# Patient Record
Sex: Female | Born: 1960 | Race: White | Hispanic: No | Marital: Married | Smoking: Never smoker
Health system: Southern US, Community
[De-identification: ages and names within clinical notes are randomized; demographics above are authoritative.]

## PROBLEM LIST (undated history)

## (undated) DIAGNOSIS — R112 Nausea with vomiting, unspecified: Secondary | ICD-10-CM

## (undated) DIAGNOSIS — Z9889 Other specified postprocedural states: Secondary | ICD-10-CM

## (undated) DIAGNOSIS — Z923 Personal history of irradiation: Secondary | ICD-10-CM

## (undated) DIAGNOSIS — E7800 Pure hypercholesterolemia, unspecified: Secondary | ICD-10-CM

## (undated) DIAGNOSIS — C50919 Malignant neoplasm of unspecified site of unspecified female breast: Secondary | ICD-10-CM

## (undated) DIAGNOSIS — F410 Panic disorder [episodic paroxysmal anxiety] without agoraphobia: Secondary | ICD-10-CM

## (undated) HISTORY — DX: Malignant neoplasm of unspecified site of unspecified female breast: C50.919

## (undated) HISTORY — PX: APPENDECTOMY: SHX54

## (undated) HISTORY — DX: Panic disorder (episodic paroxysmal anxiety): F41.0

## (undated) HISTORY — DX: Pure hypercholesterolemia, unspecified: E78.00

## (undated) HISTORY — PX: ABDOMINAL HYSTERECTOMY: SHX81

---

## 2004-06-09 ENCOUNTER — Ambulatory Visit: Payer: Self-pay | Admitting: Obstetrics and Gynecology

## 2005-06-19 ENCOUNTER — Ambulatory Visit: Payer: Self-pay | Admitting: Obstetrics and Gynecology

## 2006-06-20 ENCOUNTER — Ambulatory Visit: Payer: Self-pay | Admitting: Obstetrics and Gynecology

## 2006-07-02 ENCOUNTER — Ambulatory Visit: Payer: Self-pay | Admitting: Obstetrics and Gynecology

## 2007-06-26 ENCOUNTER — Ambulatory Visit: Payer: Self-pay | Admitting: Obstetrics and Gynecology

## 2007-07-03 ENCOUNTER — Ambulatory Visit: Payer: Self-pay | Admitting: Obstetrics and Gynecology

## 2007-12-30 ENCOUNTER — Ambulatory Visit: Payer: Self-pay | Admitting: General Surgery

## 2008-06-30 ENCOUNTER — Ambulatory Visit: Payer: Self-pay | Admitting: Obstetrics and Gynecology

## 2008-07-20 ENCOUNTER — Ambulatory Visit: Payer: Self-pay | Admitting: Obstetrics and Gynecology

## 2009-07-06 ENCOUNTER — Ambulatory Visit: Payer: Self-pay | Admitting: Obstetrics and Gynecology

## 2010-07-12 ENCOUNTER — Ambulatory Visit: Payer: Self-pay | Admitting: Obstetrics and Gynecology

## 2010-07-19 ENCOUNTER — Ambulatory Visit: Payer: Self-pay | Admitting: Obstetrics and Gynecology

## 2010-07-21 ENCOUNTER — Ambulatory Visit: Payer: Self-pay | Admitting: Obstetrics and Gynecology

## 2011-08-22 ENCOUNTER — Ambulatory Visit: Payer: Self-pay | Admitting: Obstetrics and Gynecology

## 2012-11-06 ENCOUNTER — Ambulatory Visit: Payer: Self-pay | Admitting: Obstetrics and Gynecology

## 2013-09-10 ENCOUNTER — Emergency Department: Payer: Self-pay | Admitting: Emergency Medicine

## 2013-09-10 LAB — CBC
HCT: 44.3 % (ref 35.0–47.0)
HGB: 15.1 g/dL (ref 12.0–16.0)
MCH: 33.4 pg (ref 26.0–34.0)
MCHC: 34.1 g/dL (ref 32.0–36.0)
MCV: 98 fL (ref 80–100)
PLATELETS: 282 10*3/uL (ref 150–440)
RBC: 4.52 10*6/uL (ref 3.80–5.20)
RDW: 12.8 % (ref 11.5–14.5)
WBC: 5.8 10*3/uL (ref 3.6–11.0)

## 2013-09-10 LAB — COMPREHENSIVE METABOLIC PANEL
Albumin: 4.3 g/dL (ref 3.4–5.0)
Alkaline Phosphatase: 82 U/L
Anion Gap: 5 — ABNORMAL LOW (ref 7–16)
BUN: 11 mg/dL (ref 7–18)
Bilirubin,Total: 0.6 mg/dL (ref 0.2–1.0)
CO2: 28 mmol/L (ref 21–32)
CREATININE: 0.85 mg/dL (ref 0.60–1.30)
Calcium, Total: 9.9 mg/dL (ref 8.5–10.1)
Chloride: 106 mmol/L (ref 98–107)
EGFR (African American): >60
EGFR (Non-African Amer.): >60
GLUCOSE: 87 mg/dL (ref 65–99)
Osmolality: 276 (ref 275–301)
Potassium: 4.1 mmol/L (ref 3.5–5.1)
SGOT(AST): 24 U/L (ref 15–37)
SGPT (ALT): 18 U/L (ref 12–78)
Sodium: 139 mmol/L (ref 136–145)
Total Protein: 8.6 g/dL — ABNORMAL HIGH (ref 6.4–8.2)

## 2013-09-10 LAB — TROPONIN I

## 2013-11-19 ENCOUNTER — Ambulatory Visit: Payer: Self-pay | Admitting: Obstetrics and Gynecology

## 2016-05-02 ENCOUNTER — Other Ambulatory Visit: Payer: Self-pay | Admitting: Obstetrics & Gynecology

## 2016-05-02 DIAGNOSIS — Z1231 Encounter for screening mammogram for malignant neoplasm of breast: Secondary | ICD-10-CM

## 2016-05-18 DIAGNOSIS — R072 Precordial pain: Secondary | ICD-10-CM | POA: Insufficient documentation

## 2016-05-18 DIAGNOSIS — E782 Mixed hyperlipidemia: Secondary | ICD-10-CM | POA: Insufficient documentation

## 2016-06-09 ENCOUNTER — Encounter: Payer: Self-pay | Admitting: Radiology

## 2016-06-09 ENCOUNTER — Ambulatory Visit
Admission: RE | Admit: 2016-06-09 | Discharge: 2016-06-09 | Disposition: A | Discharge status: Home / Self Care | Payer: BC Managed Care – PPO | Source: Ambulatory Visit | Attending: Obstetrics & Gynecology | Admitting: Obstetrics & Gynecology

## 2016-06-09 DIAGNOSIS — Z1231 Encounter for screening mammogram for malignant neoplasm of breast: Secondary | ICD-10-CM | POA: Diagnosis not present

## 2017-05-10 ENCOUNTER — Other Ambulatory Visit: Payer: Self-pay | Admitting: Obstetrics & Gynecology

## 2017-05-10 DIAGNOSIS — Z1231 Encounter for screening mammogram for malignant neoplasm of breast: Secondary | ICD-10-CM

## 2017-05-10 DIAGNOSIS — T7491XA Unspecified adult maltreatment, confirmed, initial encounter: Secondary | ICD-10-CM | POA: Insufficient documentation

## 2017-06-18 ENCOUNTER — Ambulatory Visit
Admission: RE | Admit: 2017-06-18 | Discharge: 2017-06-18 | Disposition: A | Discharge status: Home / Self Care | Payer: BC Managed Care – PPO | Source: Ambulatory Visit | Attending: Obstetrics & Gynecology | Admitting: Obstetrics & Gynecology

## 2017-06-18 DIAGNOSIS — Z1231 Encounter for screening mammogram for malignant neoplasm of breast: Secondary | ICD-10-CM | POA: Diagnosis not present

## 2018-05-22 ENCOUNTER — Other Ambulatory Visit: Payer: Self-pay | Admitting: Obstetrics & Gynecology

## 2018-05-22 DIAGNOSIS — Z1231 Encounter for screening mammogram for malignant neoplasm of breast: Secondary | ICD-10-CM

## 2018-06-19 ENCOUNTER — Ambulatory Visit
Admission: RE | Admit: 2018-06-19 | Discharge: 2018-06-19 | Disposition: A | Discharge status: Home / Self Care | Payer: BC Managed Care – PPO | Source: Ambulatory Visit | Attending: Obstetrics & Gynecology | Admitting: Obstetrics & Gynecology

## 2018-06-19 DIAGNOSIS — Z1231 Encounter for screening mammogram for malignant neoplasm of breast: Secondary | ICD-10-CM | POA: Insufficient documentation

## 2018-06-27 ENCOUNTER — Other Ambulatory Visit: Payer: Self-pay | Admitting: Obstetrics & Gynecology

## 2018-06-27 DIAGNOSIS — N6320 Unspecified lump in the left breast, unspecified quadrant: Secondary | ICD-10-CM

## 2018-06-27 DIAGNOSIS — R928 Other abnormal and inconclusive findings on diagnostic imaging of breast: Secondary | ICD-10-CM

## 2018-07-03 DIAGNOSIS — C50919 Malignant neoplasm of unspecified site of unspecified female breast: Secondary | ICD-10-CM

## 2018-07-03 HISTORY — DX: Malignant neoplasm of unspecified site of unspecified female breast: C50.919

## 2018-07-05 ENCOUNTER — Ambulatory Visit
Admission: RE | Admit: 2018-07-05 | Discharge: 2018-07-05 | Disposition: A | Discharge status: Home / Self Care | Payer: BC Managed Care – PPO | Source: Ambulatory Visit | Attending: Obstetrics & Gynecology | Admitting: Obstetrics & Gynecology

## 2018-07-05 DIAGNOSIS — N6320 Unspecified lump in the left breast, unspecified quadrant: Secondary | ICD-10-CM

## 2018-07-05 DIAGNOSIS — R928 Other abnormal and inconclusive findings on diagnostic imaging of breast: Secondary | ICD-10-CM | POA: Diagnosis not present

## 2018-07-09 ENCOUNTER — Other Ambulatory Visit: Payer: Self-pay | Admitting: Obstetrics & Gynecology

## 2018-07-09 DIAGNOSIS — R928 Other abnormal and inconclusive findings on diagnostic imaging of breast: Secondary | ICD-10-CM

## 2018-07-10 ENCOUNTER — Ambulatory Visit
Admission: RE | Admit: 2018-07-10 | Discharge: 2018-07-10 | Disposition: A | Discharge status: Home / Self Care | Payer: BC Managed Care – PPO | Source: Ambulatory Visit | Attending: Obstetrics & Gynecology | Admitting: Obstetrics & Gynecology

## 2018-07-10 DIAGNOSIS — R928 Other abnormal and inconclusive findings on diagnostic imaging of breast: Secondary | ICD-10-CM

## 2018-07-10 HISTORY — PX: BREAST BIOPSY: SHX20

## 2018-07-11 ENCOUNTER — Other Ambulatory Visit: Payer: Self-pay | Admitting: Anatomic Pathology & Clinical Pathology

## 2018-07-11 NOTE — Progress Notes (Signed)
.  md

## 2018-07-12 ENCOUNTER — Encounter: Payer: Self-pay | Admitting: *Deleted

## 2018-07-12 DIAGNOSIS — C50912 Malignant neoplasm of unspecified site of left female breast: Secondary | ICD-10-CM

## 2018-07-12 NOTE — Progress Notes (Signed)
  Oncology Nurse Navigator Documentation  Navigator Location: [facility] (07/12/18 1100) Referral date to RadOnc/MedOnc: 07/16/18 (07/12/18 1100) )Navigator Encounter Type: Introductory phone call (07/12/18 1100)   Abnormal Finding Date: 07/05/18 (07/12/18 1100) Confirmed Diagnosis Date: 07/12/18 (07/12/18 1100)                   Barriers/Navigation Needs: Coordination of Care (07/12/18 1100)                          Time Spent with Patient: 30 (07/12/18 1100)   Called patient today to establish navigation services.  Patient is newly diagnosed with left invasive mammary carcinoma.  I have scheduled her to see Dr. Janese Banks on 07/16/18 @ 3:00 and Dr. Lysle Pearl on 07/17/18 @ 1:30.  Will give educational material on Tuesday at her appointment.  She is to call if she has any questions or needs.

## 2018-07-15 ENCOUNTER — Other Ambulatory Visit: Payer: Self-pay | Admitting: Anatomic Pathology & Clinical Pathology

## 2018-07-15 LAB — SURGICAL PATHOLOGY

## 2018-07-16 ENCOUNTER — Inpatient Hospital Stay: Payer: BC Managed Care – PPO | Attending: Oncology | Admitting: Oncology

## 2018-07-16 ENCOUNTER — Inpatient Hospital Stay: Payer: BC Managed Care – PPO

## 2018-07-16 ENCOUNTER — Encounter: Payer: Self-pay | Admitting: *Deleted

## 2018-07-16 ENCOUNTER — Encounter: Payer: Self-pay | Admitting: Oncology

## 2018-07-16 VITALS — BP 133/81 | HR 93 | Temp 98.3°F | Resp 18 | Ht 64.0 in | Wt 142.5 lb

## 2018-07-16 DIAGNOSIS — Z8659 Personal history of other mental and behavioral disorders: Secondary | ICD-10-CM | POA: Diagnosis not present

## 2018-07-16 DIAGNOSIS — Z7189 Other specified counseling: Secondary | ICD-10-CM

## 2018-07-16 DIAGNOSIS — Z803 Family history of malignant neoplasm of breast: Secondary | ICD-10-CM

## 2018-07-16 DIAGNOSIS — Z170 Estrogen receptor positive status [ER+]: Secondary | ICD-10-CM | POA: Diagnosis not present

## 2018-07-16 DIAGNOSIS — C50412 Malignant neoplasm of upper-outer quadrant of left female breast: Secondary | ICD-10-CM | POA: Insufficient documentation

## 2018-07-16 DIAGNOSIS — C50912 Malignant neoplasm of unspecified site of left female breast: Secondary | ICD-10-CM

## 2018-07-16 DIAGNOSIS — C779 Secondary and unspecified malignant neoplasm of lymph node, unspecified: Secondary | ICD-10-CM | POA: Insufficient documentation

## 2018-07-16 DIAGNOSIS — Z79899 Other long term (current) drug therapy: Secondary | ICD-10-CM | POA: Diagnosis not present

## 2018-07-16 NOTE — Progress Notes (Signed)
  Oncology Nurse Navigator Documentation  Navigator Location: [facility] (07/16/18 1500) Referral date to RadOnc/MedOnc: 07/16/18 (07/16/18 1500) )                      Patient Visit Type: MedOnc (07/16/18 1500) Treatment Phase: Pre-Tx/Tx Discussion (07/16/18 1500) Barriers/Navigation Needs: Education (07/16/18 1500) Education: Newly Diagnosed Cancer Education (07/16/18 1500) Interventions: Education (07/16/18 1500)     Education Method: Written (07/16/18 1500)                Time Spent with Patient: 35 (07/16/18 1500)   Met patient, her husband and daughter during her initial medical oncology consult with Dr. Janese Banks.  Patient is scheduled to see Dr. Lysle Pearl tomorrow.  She is to let me know her surgery date.  To inform Dr. Janese Banks when path is available.  She will need Oncotype Dx testing.  Gave patient breast cancer educational literature, "My Breast Cancer Treatment Handbook" by Josephine Igo, RN.  She is to call with any questions or needs.

## 2018-07-16 NOTE — Progress Notes (Signed)
Pt here as new breast cancer dx. She is nervous about coming today and the word cancer. She will see Dr. Lysle Pearl the surgeon tom.

## 2018-07-17 LAB — ESTRADIOL

## 2018-07-17 LAB — FSH/LH
FSH: 199.2 m[IU]/mL
LH: 95.7 m[IU]/mL

## 2018-07-18 ENCOUNTER — Encounter: Payer: Self-pay | Admitting: Oncology

## 2018-07-18 ENCOUNTER — Other Ambulatory Visit: Payer: Self-pay | Admitting: Surgery

## 2018-07-18 ENCOUNTER — Ambulatory Visit: Payer: Self-pay | Admitting: Surgery

## 2018-07-18 DIAGNOSIS — C50912 Malignant neoplasm of unspecified site of left female breast: Secondary | ICD-10-CM | POA: Insufficient documentation

## 2018-07-18 DIAGNOSIS — Z7189 Other specified counseling: Secondary | ICD-10-CM | POA: Insufficient documentation

## 2018-07-18 DIAGNOSIS — C50412 Malignant neoplasm of upper-outer quadrant of left female breast: Secondary | ICD-10-CM

## 2018-07-18 NOTE — H&P (View-Only) (Signed)
Subjective:   CC: Malignant neoplasm of upper-outer quadrant of left female breast, unspecified estrogen receptor status (CMS-HCC) [C50.412] HPI:  Tabitha Tucker is a 58 y.o. female who was referred by Tabitha Lack Ward, MD for evaluation of above. Change was noted on last screening mammogram. Patient does not routinely do self breast exams.  Patient has not noted a change on breast exam. Age of menarche was 31. Hx of hysterectomy in her 31s Patient reports hormonal therapy, OCPs for about ten years or so. Patient is G3P2. Age of first live birth was 5. Patient did not breast feed. Patient denies nipple discharge. Patient denies previous breast biopsy. Patient denies a personal history of breast cancer.   Past Medical History:  has a past medical history of Anxiety.  Past Surgical History:  has a past surgical history that includes Colonoscopy (01/08/14); Appendectomy (1978); and Hysterectomy.  Family History: family history is not on file.  Social History:  reports that she has never smoked. She has never used smokeless tobacco. She reports current alcohol use. She reports that she does not use drugs.  Current Medications: has a current medication list which includes the following prescription(s): alprazolam, biotin, coenzyme q10, ferrous fumarate/vit bcomp,c, garlic, selenium, simvastatin, and compound medication.  Allergies:     Allergies as of 07/17/2018  . (No Known Allergies)    ROS:  A 15 point review of systems was performed and was negative except as noted in HPI   Objective:   BP 124/81   Pulse 79   Ht 162.6 cm (5' 4.02")   Wt 64.9 kg (143 lb)   SpO2 98%   BMI 24.53 kg/m   Constitutional :  alert, appears stated age, cooperative and no distress  Lymphatics/Throat:  no asymmetry, masses, or scars  Respiratory:  clear to auscultation bilaterally  Cardiovascular:  regular rate and rhythm  Gastrointestinal: soft, non-tender; bowel sounds normal; no masses,  no  organomegaly.   Musculoskeletal: Steady gait and movement  Skin: Cool and moist  Psychiatric: Normal affect, non-agitated, not confused  Breast:  Chaperone present for exam.  breasts appear normal, no suspicious masses, no skin or nipple changes or axillary nodes.    LABS:   Reason for Addendum #1: Breast Biomarker Results   SPECIMEN SUBMITTED:  A. Breast, left   CLINICAL HISTORY:  Mass/distortion   PRE-OPERATIVE DIAGNOSIS:  R/O IMC   POST-OPERATIVE DIAGNOSIS:  US guided biopsy of LEFT breast at 1 o'clock, 5 cmfn, with placement of  venus-shaped clip      DIAGNOSIS:  A. BREAST, LEFT AT 1:00, 5 CM FROM NIPPLE; ULTRASOUND-GUIDED CORE  BIOPSY:  - INVASIVE MAMMARY CARCINOMA, NO SPECIAL TYPE.   Size of invasive carcinoma: 9 mm in this sample  Histologic grade of invasive carcinoma: Grade 2            Glandular/tubular differentiation score: 3            Nuclear pleomorphism score: 2            Mitotic rate score: 2            Total score: 7  Ductal carcinoma in situ: Not identified  Lymphovascular invasion: Present   ER/PR/HER2: Immunohistochemistry will be performed on block A2, with  reflex to [place] for HER2 2+. The results will be reported in an addendum.   Results were communicated to Tabitha Tucker in the [facility] at 2:35 pm on 07/11/2018.    GROSS DESCRIPTION:  A. Labeled: Left  breast 1:00 5 cm from nipple upper outer quadrant  Received: Formalin  Time/date in fixative: Collected and placed into formalin at 1:27 PM on  07/10/2018  Cold ischemic time: Less than 1 hour  Total fixation time: 8.5 hours  Core pieces: 4  Size: Ranging from 0.8-1.6 cm in length and 0.2 cm in diameter  Description: Needle core biopsy fragments of tan-yellow fibrofatty  tissue  Ink color: Green  Entirely submitted in cassettes 1-2.     Final Diagnosis performed by Allena Napoleon, MD.  Electronically signed  07/11/2018 4:25:53PM   The electronic signature indicates that the named Attending Pathologist  has evaluated the specimen  Technical component performed at [facility], [address], [place],  [place] [address] Lab: [phone] Dir: Rush Farmer, MD, MMM Professional  component performed at [facility], [place], [place], [place] [address] Lab: [phone] Dir: Dellia Nims.  Rubinas, MD   ADDENDUM:  BREAST BIOMARKER TESTS  Estrogen Receptor (ER) Status: POSITIVE            Percentage of cells with nuclear positivity:  Greater than 90%            Average intensity of staining: Strong   Progesterone Receptor (PgR) Status: POSITIVE            Percentage of cells with nuclear positivity: 11-50%            Average intensity of staining: Moderate   HER2 (by immunohistochemistry): Negative (score 0)   Cold Ischemia and Fixation Times: Meet requirements specified in latest  version of the ASCO/CAP guidelines  Testing Performed on Block Number(s): A2   METHODS  Fixative: Formalin  Estrogen Receptor: FDA cleared ([place]) Primary Antibody: SP1  Progesterone Receptor: FDA cleared ([place]) Primary Antibody: 1E2  HER2 (by IHC): FDA cleared ([place]) Primary Antibody: 4B5 ([facility])  Immunohistochemistry controls worked appropriately. Slides were prepared  by [facility], [place], [place], and interpreted by Allena Napoleon,  MD.   This test was developed and its performance characteristics determined  by [facility]. It has not been cleared or approved by the [place] Food and Drug  Administration. The FDA does not require this test to go through  premarket FDA review. This test is used for clinical purposes. It should  not be regarded as investigational or for research. This laboratory is  certified under the Clinical Laboratory Improvement Amendments (CLIA) as  qualified to perform high complexity clinical laboratory testing.          Addendum #1 performed by Allena Napoleon, MD.  Electronically signed  07/15/2018 3:33:31PM  The electronic signature indicates that the named Attending Pathologist  has evaluated the specimen  Technical component performed at [facility], [address] [place],  [place] [address] Lab: [phone] Dir: Rush Farmer, MD, MMM Professional  component performed at [facility], [facility], [place], [place], [place] [address] Lab: [phone] Dir: Dellia Nims.  Rubinas, MD   RADS: CLINICAL DATA: Left breast upper outer quadrant mass/distortion seen on most recent screening mammography.  EXAM: DIGITAL DIAGNOSTIC LEFT MAMMOGRAM WITH CAD AND TOMO  ULTRASOUND LEFT BREAST  COMPARISON: Previous exam(s).  ACR Breast Density Category b: There are scattered areas of fibroglandular density.  FINDINGS: Additional mammographic views of the left breast demonstrate persistent distortion in the left breast upper outer quadrant, middle depth.  Mammographic images were processed with CAD.  On physical exam, subtle palpable abnormalities detected in the left 1 o'clock breast 5 cm from the nipple.  Targeted ultrasound is performed, showing left breast 1 o'clock 5 cm from the nipple a hypoechoic irregular shadowing area which measures 1.7 by 1.2 by 1.6 cm. There is increased vascularity to the periphery of the lesion. This finding likely corresponds to the distortion seen mammographically. There is no evidence of left axillary lymphadenopathy.  IMPRESSION: Left breast 1 o'clock suspicious area of architectural distortion/sonographic irregular shadowing area, for which ultrasound-guided core needle biopsy is recommended.  RECOMMENDATION: Ultrasound-guided core needle biopsy of the left breast.  I have discussed the findings and recommendations with the patient. Results were also provided in writing at the conclusion of the visit. If applicable, a  reminder letter will be sent to the patient regarding the next appointment.  BI-RADS CATEGORY 4: Suspicious.   Electronically Signed By: Fidela Salisbury M.D. On: 07/05/2018 17:32  Assessment:   Malignant neoplasm of upper-outer quadrant of left female breast, unspecified estrogen receptor status (CMS-HCC) [C50.412]  Plan:   1. Malignant neoplasm of upper-outer quadrant of left female breast, unspecified estrogen receptor status (CMS-HCC) [C50.412]  Discussed the risk of surgery including recurrence, chronic pain, post-op infxn, poor/delayed wound healing, poor cosmesis, seroma, hematoma formation, and possible re-operation to address said risks. The risks of general anesthetic, if used, includes MI, CVA, sudden death or even reaction to anesthetic medications also discussed.  Typical post-op recovery time and possbility of activity restrictions were also discussed.  Alternatives include continued observation.  Benefits include possible symptom relief, pathologic evaluation, and/or curative excision.   The patient verbalized understanding and all questions were answered to the patient's satisfaction.  2. Patient has elected to proceed with surgical treatment. Procedure will be scheduled.  Written consent was obtained. Partial mastectomy with SLNB.     Electronically signed by Benjamine Sprague, DO on 07/17/2018 2:29 PM
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
## 2018-07-18 NOTE — H&P (Signed)
Subjective:   CC: Malignant neoplasm of upper-outer quadrant of left female breast, unspecified estrogen receptor status (CMS-HCC) [C50.412] HPI:  Tabitha Tucker is a 57 y.o. female who was referred by Chelsea Crist Ward, MD for evaluation of above. Change was noted on last screening mammogram. Patient does not routinely do self breast exams.  Patient has not noted a change on breast exam. Age of menarche was 13. Hx of hysterectomy in her 40s Patient reports hormonal therapy, OCPs for about ten years or so. Patient is G3P2. Age of first live birth was 24. Patient did not breast feed. Patient denies nipple discharge. Patient denies previous breast biopsy. Patient denies a personal history of breast cancer.   Past Medical History:  has a past medical history of Anxiety.  Past Surgical History:  has a past surgical history that includes Colonoscopy (01/08/14); Appendectomy (1978); and Hysterectomy.  Family History: family history is not on file.  Social History:  reports that she has never smoked. She has never used smokeless tobacco. She reports current alcohol use. She reports that she does not use drugs.  Current Medications: has a current medication list which includes the following prescription(s): alprazolam, biotin, coenzyme q10, ferrous fumarate/vit bcomp,c, garlic, selenium, simvastatin, and compound medication.  Allergies:     Allergies as of 07/17/2018  . (No Known Allergies)    ROS:  A 15 point review of systems was performed and was negative except as noted in HPI   Objective:   BP 124/81   Pulse 79   Ht 162.6 cm (5' 4.02")   Wt 64.9 kg (143 lb)   SpO2 98%   BMI 24.53 kg/m   Constitutional :  alert, appears stated age, cooperative and no distress  Lymphatics/Throat:  no asymmetry, masses, or scars  Respiratory:  clear to auscultation bilaterally  Cardiovascular:  regular rate and rhythm  Gastrointestinal: soft, non-tender; bowel sounds normal; no masses,  no  organomegaly.   Musculoskeletal: Steady gait and movement  Skin: Cool and moist  Psychiatric: Normal affect, non-agitated, not confused  Breast:  Chaperone present for exam.  breasts appear normal, no suspicious masses, no skin or nipple changes or axillary nodes.    LABS:   Reason for Addendum #1: Breast Biomarker Results   SPECIMEN SUBMITTED:  A. Breast, left   CLINICAL HISTORY:  Mass/distortion   PRE-OPERATIVE DIAGNOSIS:  R/O IMC   POST-OPERATIVE DIAGNOSIS:  US guided biopsy of LEFT breast at 1 o'clock, 5 cmfn, with placement of  venus-shaped clip      DIAGNOSIS:  A. BREAST, LEFT AT 1:00, 5 CM FROM NIPPLE; ULTRASOUND-GUIDED CORE  BIOPSY:  - INVASIVE MAMMARY CARCINOMA, NO SPECIAL TYPE.   Size of invasive carcinoma: 9 mm in this sample  Histologic grade of invasive carcinoma: Grade 2            Glandular/tubular differentiation score: 3            Nuclear pleomorphism score: 2            Mitotic rate score: 2            Total score: 7  Ductal carcinoma in situ: Not identified  Lymphovascular invasion: Present   ER/PR/HER2: Immunohistochemistry will be performed on block A2, with  reflex to FISH for HER2 2+. The results will be reported in an addendum.   Results were communicated to Samantha Smith in the [facility] at 2:35 pm on 07/11/2018.    GROSS DESCRIPTION:  A. Labeled: Left   breast 1:00 5 cm from nipple upper outer quadrant  Received: Formalin  Time/date in fixative: Collected and placed into formalin at 1:27 PM on  07/10/2018  Cold ischemic time: Less than 1 hour  Total fixation time: 8.5 hours  Core pieces: 4  Size: Ranging from 0.8-1.6 cm in length and 0.2 cm in diameter  Description: Needle core biopsy fragments of tan-yellow fibrofatty  tissue  Ink color: Green  Entirely submitted in cassettes 1-2.     Final Diagnosis performed by Heath Jones, MD.  Electronically signed  07/11/2018 4:25:53PM   The electronic signature indicates that the named Attending Pathologist  has evaluated the specimen  Technical component performed at [facility], [address], [place],  [place] [address] Lab: [phone] Dir: Sanjai Nagendra, MD, MMM Professional  component performed at [facility], [facility], [address], [place], [place] [address] Lab: [phone] Dir: Tara C.  Rubinas, MD   ADDENDUM:  BREAST BIOMARKER TESTS  Estrogen Receptor (ER) Status: POSITIVE            Percentage of cells with nuclear positivity:  Greater than 90%            Average intensity of staining: Strong   Progesterone Receptor (PgR) Status: POSITIVE            Percentage of cells with nuclear positivity: 11-50%            Average intensity of staining: Moderate   HER2 (by immunohistochemistry): Negative (score 0)   Cold Ischemia and Fixation Times: Meet requirements specified in latest  version of the ASCO/CAP guidelines  Testing Performed on Block Number(s): A2   METHODS  Fixative: Formalin  Estrogen Receptor: FDA cleared ([place]) Primary Antibody: SP1  Progesterone Receptor: FDA cleared ([place]) Primary Antibody: 1E2  HER2 (by IHC): FDA cleared ([place]) Primary Antibody: 4B5 ([facility])  Immunohistochemistry controls worked appropriately. Slides were prepared  by [facility], [place], [place], and interpreted by Heath Jones,  MD.   This test was developed and its performance characteristics determined  by [facility]. It has not been cleared or approved by the US Food and Drug  Administration. The FDA does not require this test to go through  premarket FDA review. This test is used for clinical purposes. It should  not be regarded as investigational or for research. This laboratory is  certified under the Clinical Laboratory Improvement Amendments (CLIA) as  qualified to perform high complexity clinical laboratory testing.          Addendum #1 performed by Heath Jones, MD.  Electronically signed  07/15/2018 3:33:31PM  The electronic signature indicates that the named Attending Pathologist  has evaluated the specimen  Technical component performed at [facility], [address], [place],  [place] [address] Lab: [phone] Dir: Sanjai Nagendra, MD, MMM Professional  component performed at [facility], [facility], [address], [place],  [address] Lab: [phone] Dir: Tara C.  Rubinas, MD   RADS: CLINICAL DATA: Left breast upper outer quadrant mass/distortion seen on most recent screening mammography.  EXAM: DIGITAL DIAGNOSTIC LEFT MAMMOGRAM WITH CAD AND TOMO  ULTRASOUND LEFT BREAST  COMPARISON: Previous exam(s).  ACR Breast Density Category b: There are scattered areas of fibroglandular density.  FINDINGS: Additional mammographic views of the left breast demonstrate persistent distortion in the left breast upper outer quadrant, middle depth.  Mammographic images were processed with CAD.  On physical exam, subtle palpable abnormalities detected in the left 1 o'clock breast 5 cm from the nipple.    Targeted ultrasound is performed, showing left breast 1 o'clock 5 cm from the nipple a hypoechoic irregular shadowing area which measures 1.7 by 1.2 by 1.6 cm. There is increased vascularity to the periphery of the lesion. This finding likely corresponds to the distortion seen mammographically. There is no evidence of left axillary lymphadenopathy.  IMPRESSION: Left breast 1 o'clock suspicious area of architectural distortion/sonographic irregular shadowing area, for which ultrasound-guided core needle biopsy is recommended.  RECOMMENDATION: Ultrasound-guided core needle biopsy of the left breast.  I have discussed the findings and recommendations with the patient. Results were also provided in writing at the conclusion of the visit. If applicable, a  reminder letter will be sent to the patient regarding the next appointment.  BI-RADS CATEGORY 4: Suspicious.   Electronically Signed By: Dobrinka Dimitrova M.D. On: 07/05/2018 17:32  Assessment:   Malignant neoplasm of upper-outer quadrant of left female breast, unspecified estrogen receptor status (CMS-HCC) [C50.412]  Plan:   1. Malignant neoplasm of upper-outer quadrant of left female breast, unspecified estrogen receptor status (CMS-HCC) [C50.412]  Discussed the risk of surgery including recurrence, chronic pain, post-op infxn, poor/delayed wound healing, poor cosmesis, seroma, hematoma formation, and possible re-operation to address said risks. The risks of general anesthetic, if used, includes MI, CVA, sudden death or even reaction to anesthetic medications also discussed.  Typical post-op recovery time and possbility of activity restrictions were also discussed.  Alternatives include continued observation.  Benefits include possible symptom relief, pathologic evaluation, and/or curative excision.   The patient verbalized understanding and all questions were answered to the patient's satisfaction.  2. Patient has elected to proceed with surgical treatment. Procedure will be scheduled.  Written consent was obtained. Partial mastectomy with SLNB.     Electronically signed by Esdras Delair, DO on 07/17/2018 2:29 PM      

## 2018-07-19 ENCOUNTER — Other Ambulatory Visit: Payer: Self-pay | Admitting: Surgery

## 2018-07-19 DIAGNOSIS — C50412 Malignant neoplasm of upper-outer quadrant of left female breast: Secondary | ICD-10-CM

## 2018-07-23 ENCOUNTER — Other Ambulatory Visit: Payer: Self-pay

## 2018-07-23 ENCOUNTER — Encounter
Admission: RE | Admit: 2018-07-23 | Discharge: 2018-07-23 | Disposition: A | Discharge status: Home / Self Care | Payer: BC Managed Care – PPO | Source: Ambulatory Visit | Attending: Surgery | Admitting: Surgery

## 2018-07-23 ENCOUNTER — Other Ambulatory Visit: Payer: Self-pay | Admitting: *Deleted

## 2018-07-23 NOTE — Patient Instructions (Signed)
Your procedure is scheduled on: 07/25/2018 REPORT TO [place] AT 7:45 AM    Remember: Instructions that are not followed completely may result in serious medical risk, up to and including death, or upon the discretion of your surgeon and anesthesiologist your surgery may need to be rescheduled.     _X__ 1. Do not eat food after midnight the night before your procedure.                 No gum chewing or hard candies. You may drink clear liquids up to 2 hours                 before you are scheduled to arrive for your surgery- DO not drink clear                 liquids within 2 hours of the start of your surgery.                 Clear Liquids include:  water, apple juice without pulp, clear carbohydrate                 drink such as Clearfast or Gatorade, Black Coffee or Tea (Do not add                 anything to coffee or tea).  __X__2.  On the morning of surgery brush your teeth with toothpaste and water, you                 may rinse your mouth with mouthwash if you wish.  Do not swallow any              toothpaste of mouthwash.     _X__ 3.  No Alcohol for 24 hours before or after surgery.   _X__ 4.  Do Not Smoke or use e-cigarettes For 24 Hours Prior to Your Surgery.                 Do not use any chewable tobacco products for at least 6 hours prior to                 surgery.  ____  5.  Bring all medications with you on the day of surgery if instructed.   __X__  6.  Notify your doctor if there is any change in your medical condition      (cold, fever, infections).     Do not wear jewelry, make-up, hairpins, clips or nail polish. Do not wear lotions, powders, or perfumes.  Do not shave 48 hours prior to surgery. Men may shave face and neck. Do not bring valuables to the hospital.    [facility] is not responsible for any belongings or valuables.  Contacts, dentures/partials or body piercings may not be worn into surgery. Bring a case for your contacts, glasses or  hearing aids, a denture cup will be supplied. Leave your suitcase in the car. After surgery it may be brought to your room. For patients admitted to the hospital, discharge time is determined by your treatment team.   Patients discharged the day of surgery will not be allowed to drive home.   Please read over the following fact sheets that you were given:     __X__ Take these medicines the morning of surgery with A SIP OF WATER:    1. XANAX IF NEEDED  2.   3.   4.  5.  6.  ____ Fleet Enema (as  directed)   ____ Use CHG Soap/SAGE wipes as directed  ____ Use inhalers on the day of surgery  ____ Stop metformin/Janumet/Farxiga 2 days prior to surgery    ____ Take 1/2 of usual insulin dose the night before surgery. No insulin the morning          of surgery.   ____ Stop Blood Thinners Coumadin/Plavix/Xarelto/Pleta/Pradaxa/Eliquis/Effient/Aspirin  on   Or contact your Surgeon, Cardiologist or Medical Doctor regarding  ability to stop your blood thinners  __X__ Stop Anti-inflammatories 7 days before surgery such as Advil, Ibuprofen, Motrin,  BC or Goodies Powder, Naprosyn, Naproxen, Aleve, Aspirin    __X__ Stop all herbal supplements, fish oil or vitamin E until after surgery.    ____ Bring C-Pap to the hospital.      PT WAS PHONE INTERVIEW 07/23/2018. VERBAL INSTRUCTIONS GIVEN, PT VERBALIZED UNDERSTANDING./CNEWMAN RN

## 2018-07-24 MED ORDER — CEFAZOLIN SODIUM-DEXTROSE 2-4 GM/100ML-% IV SOLN
2.0000 g | INTRAVENOUS | Status: AC
  Administered 2018-07-25: 2 g via INTRAVENOUS

## 2018-07-25 ENCOUNTER — Ambulatory Visit
Admission: RE | Admit: 2018-07-25 | Discharge: 2018-07-25 | Disposition: A | Discharge status: Home / Self Care | Payer: BC Managed Care – PPO | Source: Ambulatory Visit | Attending: Surgery | Admitting: Surgery

## 2018-07-25 ENCOUNTER — Other Ambulatory Visit: Payer: Self-pay

## 2018-07-25 ENCOUNTER — Other Ambulatory Visit: Payer: Self-pay | Admitting: Surgery

## 2018-07-25 ENCOUNTER — Encounter: Payer: Self-pay | Admitting: Emergency Medicine

## 2018-07-25 ENCOUNTER — Encounter: Payer: Self-pay | Admitting: Anesthesiology

## 2018-07-25 ENCOUNTER — Encounter
Admission: RE | Disposition: A | Discharge status: Home / Self Care | Payer: Self-pay | Source: Ambulatory Visit | Attending: Surgery

## 2018-07-25 ENCOUNTER — Encounter
Admission: RE | Admit: 2018-07-25 | Discharge: 2018-07-25 | Disposition: A | Discharge status: Home / Self Care | Payer: BC Managed Care – PPO | Source: Ambulatory Visit | Attending: Surgery | Admitting: Surgery

## 2018-07-25 ENCOUNTER — Ambulatory Visit: Payer: BC Managed Care – PPO | Admitting: Anesthesiology

## 2018-07-25 DIAGNOSIS — C50412 Malignant neoplasm of upper-outer quadrant of left female breast: Secondary | ICD-10-CM

## 2018-07-25 DIAGNOSIS — Z79899 Other long term (current) drug therapy: Secondary | ICD-10-CM | POA: Insufficient documentation

## 2018-07-25 DIAGNOSIS — Z170 Estrogen receptor positive status [ER+]: Secondary | ICD-10-CM | POA: Insufficient documentation

## 2018-07-25 DIAGNOSIS — C773 Secondary and unspecified malignant neoplasm of axilla and upper limb lymph nodes: Secondary | ICD-10-CM | POA: Insufficient documentation

## 2018-07-25 DIAGNOSIS — F419 Anxiety disorder, unspecified: Secondary | ICD-10-CM | POA: Diagnosis not present

## 2018-07-25 HISTORY — PX: BREAST LUMPECTOMY WITH NEEDLE LOCALIZATION AND AXILLARY SENTINEL LYMPH NODE BX: SHX5760

## 2018-07-25 HISTORY — PX: BREAST LUMPECTOMY: SHX2

## 2018-07-25 HISTORY — DX: Other specified postprocedural states: Z98.890

## 2018-07-25 HISTORY — DX: Nausea with vomiting, unspecified: R11.2

## 2018-07-25 SURGERY — BREAST LUMPECTOMY WITH NEEDLE LOCALIZATION AND AXILLARY SENTINEL LYMPH NODE BX
Anesthesia: General | Laterality: Left

## 2018-07-25 MED ORDER — ONDANSETRON HCL 4 MG/2ML IJ SOLN
INTRAMUSCULAR | Status: AC
  Filled 2018-07-25: qty 2

## 2018-07-25 MED ORDER — LACTATED RINGERS IV SOLN
INTRAVENOUS | Status: DC
  Administered 2018-07-25: 10:00:00 via INTRAVENOUS

## 2018-07-25 MED ORDER — GLYCOPYRROLATE 0.2 MG/ML IJ SOLN
INTRAMUSCULAR | Status: AC
  Filled 2018-07-25: qty 1

## 2018-07-25 MED ORDER — PROPOFOL 500 MG/50ML IV EMUL
INTRAVENOUS | Status: AC
  Filled 2018-07-25: qty 50

## 2018-07-25 MED ORDER — FENTANYL CITRATE (PF) 100 MCG/2ML IJ SOLN
INTRAMUSCULAR | Status: DC | PRN
  Administered 2018-07-25: 25 ug via INTRAVENOUS
  Administered 2018-07-25: 50 ug via INTRAVENOUS
  Administered 2018-07-25: 25 ug via INTRAVENOUS

## 2018-07-25 MED ORDER — CEFAZOLIN SODIUM-DEXTROSE 2-4 GM/100ML-% IV SOLN
INTRAVENOUS | Status: AC
  Filled 2018-07-25: qty 100

## 2018-07-25 MED ORDER — FENTANYL CITRATE (PF) 100 MCG/2ML IJ SOLN
INTRAMUSCULAR | Status: AC
  Filled 2018-07-25: qty 2

## 2018-07-25 MED ORDER — ONDANSETRON HCL 4 MG/2ML IJ SOLN: 4.0000 mg | Freq: Once | INTRAMUSCULAR | Status: DC | PRN

## 2018-07-25 MED ORDER — FAMOTIDINE 20 MG PO TABS
20.0000 mg | ORAL_TABLET | Freq: Once | ORAL | Status: AC
  Administered 2018-07-25: 20 mg via ORAL

## 2018-07-25 MED ORDER — ACETAMINOPHEN 325 MG PO TABS: 650.0000 mg | ORAL_TABLET | Freq: Three times a day (TID) | ORAL | 0 refills | Status: AC | PRN

## 2018-07-25 MED ORDER — FENTANYL CITRATE (PF) 100 MCG/2ML IJ SOLN
INTRAMUSCULAR | Status: AC
  Administered 2018-07-25: 25 ug via INTRAVENOUS
  Filled 2018-07-25: qty 2

## 2018-07-25 MED ORDER — MIDAZOLAM HCL 2 MG/2ML IJ SOLN
INTRAMUSCULAR | Status: DC | PRN
  Administered 2018-07-25: 2 mg via INTRAVENOUS

## 2018-07-25 MED ORDER — TECHNETIUM TC 99M SULFUR COLLOID FILTERED
1.0000 | Freq: Once | INTRAVENOUS | Status: AC | PRN
  Administered 2018-07-25: 0.665 via INTRADERMAL

## 2018-07-25 MED ORDER — EPHEDRINE SULFATE 50 MG/ML IJ SOLN
INTRAMUSCULAR | Status: DC | PRN
  Administered 2018-07-25: 5 mg via INTRAVENOUS
  Administered 2018-07-25: 10 mg via INTRAVENOUS

## 2018-07-25 MED ORDER — CHLORHEXIDINE GLUCONATE CLOTH 2 % EX PADS: 6.0000 | MEDICATED_PAD | Freq: Once | CUTANEOUS | Status: DC

## 2018-07-25 MED ORDER — HYDROCODONE-ACETAMINOPHEN 5-325 MG PO TABS: 1.0000 | ORAL_TABLET | Freq: Four times a day (QID) | ORAL | 0 refills | Status: AC | PRN

## 2018-07-25 MED ORDER — GABAPENTIN 300 MG PO CAPS
ORAL_CAPSULE | ORAL | Status: AC
  Filled 2018-07-25: qty 1

## 2018-07-25 MED ORDER — ACETAMINOPHEN 500 MG PO TABS
1000.0000 mg | ORAL_TABLET | ORAL | Status: AC
  Administered 2018-07-25: 1000 mg via ORAL

## 2018-07-25 MED ORDER — CELECOXIB 200 MG PO CAPS
ORAL_CAPSULE | ORAL | Status: AC
  Filled 2018-07-25: qty 1

## 2018-07-25 MED ORDER — SCOPOLAMINE 1 MG/3DAYS TD PT72
1.0000 | MEDICATED_PATCH | Freq: Once | TRANSDERMAL | Status: DC
  Administered 2018-07-25: 1.5 mg via TRANSDERMAL

## 2018-07-25 MED ORDER — HYDROMORPHONE HCL 1 MG/ML IJ SOLN
INTRAMUSCULAR | Status: AC
  Filled 2018-07-25: qty 1

## 2018-07-25 MED ORDER — GABAPENTIN 300 MG PO CAPS
300.0000 mg | ORAL_CAPSULE | ORAL | Status: AC
  Administered 2018-07-25: 300 mg via ORAL

## 2018-07-25 MED ORDER — FENTANYL CITRATE (PF) 100 MCG/2ML IJ SOLN
25.0000 ug | INTRAMUSCULAR | Status: DC | PRN
  Administered 2018-07-25 (×2): 25 ug via INTRAVENOUS

## 2018-07-25 MED ORDER — CELECOXIB 200 MG PO CAPS
200.0000 mg | ORAL_CAPSULE | ORAL | Status: AC
  Administered 2018-07-25: 200 mg via ORAL

## 2018-07-25 MED ORDER — DEXAMETHASONE SODIUM PHOSPHATE 10 MG/ML IJ SOLN
INTRAMUSCULAR | Status: AC
  Filled 2018-07-25: qty 1

## 2018-07-25 MED ORDER — SCOPOLAMINE 1 MG/3DAYS TD PT72
MEDICATED_PATCH | TRANSDERMAL | Status: AC
  Filled 2018-07-25: qty 1

## 2018-07-25 MED ORDER — MIDAZOLAM HCL 2 MG/2ML IJ SOLN
INTRAMUSCULAR | Status: AC
  Filled 2018-07-25: qty 2

## 2018-07-25 MED ORDER — LIDOCAINE HCL (PF) 2 % IJ SOLN
INTRAMUSCULAR | Status: AC
  Filled 2018-07-25: qty 10

## 2018-07-25 MED ORDER — LIDOCAINE HCL 1 % IJ SOLN
INTRAMUSCULAR | Status: DC | PRN
  Administered 2018-07-25: 10 mL via INTRAMUSCULAR

## 2018-07-25 MED ORDER — DOCUSATE SODIUM 100 MG PO CAPS: 100.0000 mg | ORAL_CAPSULE | Freq: Two times a day (BID) | ORAL | 0 refills | Status: DC | PRN

## 2018-07-25 MED ORDER — FAMOTIDINE 20 MG PO TABS
ORAL_TABLET | ORAL | Status: AC
  Filled 2018-07-25: qty 1

## 2018-07-25 MED ORDER — DEXAMETHASONE SODIUM PHOSPHATE 10 MG/ML IJ SOLN
INTRAMUSCULAR | Status: DC | PRN
  Administered 2018-07-25: 10 mg via INTRAVENOUS

## 2018-07-25 MED ORDER — GLYCOPYRROLATE 0.2 MG/ML IJ SOLN
INTRAMUSCULAR | Status: DC | PRN
  Administered 2018-07-25: 0.2 mg via INTRAVENOUS

## 2018-07-25 MED ORDER — PROPOFOL 10 MG/ML IV BOLUS
INTRAVENOUS | Status: DC | PRN
  Administered 2018-07-25: 100 mg via INTRAVENOUS
  Administered 2018-07-25: 30 mg via INTRAVENOUS

## 2018-07-25 MED ORDER — LIDOCAINE HCL (CARDIAC) PF 100 MG/5ML IV SOSY
PREFILLED_SYRINGE | INTRAVENOUS | Status: DC | PRN
  Administered 2018-07-25: 100 mg via INTRAVENOUS

## 2018-07-25 MED ORDER — ONDANSETRON HCL 4 MG/2ML IJ SOLN
INTRAMUSCULAR | Status: DC | PRN
  Administered 2018-07-25: 4 mg via INTRAVENOUS

## 2018-07-25 MED ORDER — ACETAMINOPHEN 500 MG PO TABS
ORAL_TABLET | ORAL | Status: AC
  Filled 2018-07-25: qty 2

## 2018-07-25 MED ORDER — IBUPROFEN 800 MG PO TABS: 800.0000 mg | ORAL_TABLET | Freq: Three times a day (TID) | ORAL | 0 refills | Status: DC | PRN

## 2018-07-25 MED ORDER — PHENYLEPHRINE HCL 10 MG/ML IJ SOLN
INTRAMUSCULAR | Status: DC | PRN
  Administered 2018-07-25: 50 ug via INTRAVENOUS

## 2018-07-25 SURGICAL SUPPLY — 48 items
APPLIER CLIP 11 MED OPEN (CLIP)
BLADE SURG 15 STRL LF DISP TIS (BLADE) ×1 IMPLANT
BLADE SURG 15 STRL SS (BLADE) ×1
CANISTER SUCT 1200ML W/VALVE (MISCELLANEOUS) ×2 IMPLANT
CHLORAPREP W/TINT 26ML (MISCELLANEOUS) ×2 IMPLANT
CLIP APPLIE 11 MED OPEN (CLIP) IMPLANT
CNTNR SPEC 2.5X3XGRAD LEK (MISCELLANEOUS)
CONT SPEC 4OZ STER OR WHT (MISCELLANEOUS)
CONTAINER SPEC 2.5X3XGRAD LEK (MISCELLANEOUS) IMPLANT
COVER WAND RF STERILE (DRAPES) IMPLANT
DERMABOND ADVANCED (GAUZE/BANDAGES/DRESSINGS) ×2
DERMABOND ADVANCED .7 DNX12 (GAUZE/BANDAGES/DRESSINGS) ×2 IMPLANT
DEVICE DUBIN SPECIMEN MAMMOGRA (MISCELLANEOUS) ×2 IMPLANT
DRAPE CHEST BREAST 77X106 FENE (MISCELLANEOUS) IMPLANT
DRAPE LAPAROTOMY TRNSV 106X77 (MISCELLANEOUS) ×2 IMPLANT
DRAPE SHEET LG 3/4 BI-LAMINATE (DRAPES) IMPLANT
ELECT CAUTERY BLADE TIP 2.5 (TIP) ×2
ELECT CAUTERY NEEDLE 2.0 MIC (NEEDLE) IMPLANT
ELECT REM PT RETURN 9FT ADLT (ELECTROSURGICAL) ×2
ELECTRODE CAUTERY BLDE TIP 2.5 (TIP) ×1 IMPLANT
ELECTRODE REM PT RTRN 9FT ADLT (ELECTROSURGICAL) ×1 IMPLANT
GAUZE SPONGE 4X4 12PLY STRL (GAUZE/BANDAGES/DRESSINGS) IMPLANT
GLOVE BIOGEL PI IND STRL 7.0 (GLOVE) ×3 IMPLANT
GLOVE BIOGEL PI INDICATOR 7.0 (GLOVE) ×3
GLOVE SURG SYN 7.0 (GLOVE) ×6 IMPLANT
GOWN STRL REUS W/ TWL LRG LVL3 (GOWN DISPOSABLE) ×3 IMPLANT
GOWN STRL REUS W/TWL LRG LVL3 (GOWN DISPOSABLE) ×3
JACKSON PRATT 10 (INSTRUMENTS) IMPLANT
KIT TURNOVER KIT A (KITS) ×2 IMPLANT
LABEL OR SOLS (LABEL) ×2 IMPLANT
LIGHT WAVEGUIDE WIDE FLAT (MISCELLANEOUS) ×2 IMPLANT
NEEDLE HYPO 22GX1.5 SAFETY (NEEDLE) ×2 IMPLANT
PACK BASIN MINOR ARMC (MISCELLANEOUS) ×2 IMPLANT
SLEVE PROBE SENORX GAMMA FIND (MISCELLANEOUS) ×2 IMPLANT
SUT MNCRL 4-0 (SUTURE) ×1
SUT MNCRL 4-0 27XMFL (SUTURE) ×1
SUT SILK 2 0 (SUTURE) ×1
SUT SILK 2-0 30XBRD TIE 12 (SUTURE) ×1 IMPLANT
SUT SILK 3 0 12 30 (SUTURE) ×2 IMPLANT
SUT SILK 3 0 SH 30 (SUTURE) ×4 IMPLANT
SUT SILK 3-0 (SUTURE) ×2 IMPLANT
SUT VIC AB 3-0 SH 27 (SUTURE) ×2
SUT VIC AB 3-0 SH 27X BRD (SUTURE) ×2 IMPLANT
SUTURE MNCRL 4-0 27XMF (SUTURE) ×1 IMPLANT
SYR 10ML LL (SYRINGE) ×2 IMPLANT
SYR 50ML LL SCALE MARK (SYRINGE) IMPLANT
SYR BULB IRRIG 60ML STRL (SYRINGE) ×2 IMPLANT
WATER STERILE IRR 1000ML POUR (IV SOLUTION) ×2 IMPLANT

## 2018-07-25 NOTE — Transfer of Care (Signed)
Immediate Anesthesia Transfer of Care Note  Patient: Tabitha Tucker  Procedure(s) Performed: BREAST LUMPECTOMY WITH NEEDLE LOCALIZATION AND SENTINEL NODE BX (Left )  Patient Location: PACU  Anesthesia Type:General  Level of Consciousness: sedated  Airway & Oxygen Therapy: Patient Spontanous Breathing and Patient connected to face mask oxygen  Post-op Assessment: Report given to RN and Post -op Vital signs reviewed and stable  Post vital signs: Reviewed and stable  Last Vitals:  Vitals Value Taken Time  BP 128/85 07/25/2018  1:08 PM  Temp 36.8 C 07/25/2018  1:08 PM  Pulse 87 07/25/2018  1:10 PM  Resp 11 07/25/2018  1:10 PM  SpO2 100 % 07/25/2018  1:10 PM  Vitals shown include unvalidated device data.  Last Pain:  Vitals:   07/25/18 1308  TempSrc:   PainSc: Asleep         Complications: No apparent anesthesia complications

## 2018-07-25 NOTE — Anesthesia Post-op Follow-up Note (Signed)
Anesthesia QCDR form completed.        

## 2018-07-25 NOTE — Anesthesia Procedure Notes (Signed)
Procedure Name: LMA Insertion Date/Time: 07/25/2018 11:09 AM Performed by: Justus Memory, CRNA Pre-anesthesia Checklist: Patient identified, Patient being monitored, Timeout performed, Emergency Drugs available and Suction available Patient Re-evaluated:Patient Re-evaluated prior to induction Oxygen Delivery Method: Circle system utilized Preoxygenation: Pre-oxygenation with 100% oxygen Induction Type: IV induction Ventilation: Mask ventilation without difficulty LMA: LMA inserted LMA Size: 3.5 Tube type: Oral Number of attempts: 1 Placement Confirmation: positive ETCO2 and breath sounds checked- equal and bilateral Tube secured with: Tape Dental Injury: Teeth and Oropharynx as per pre-operative assessment

## 2018-07-25 NOTE — Anesthesia Preprocedure Evaluation (Signed)
Anesthesia Evaluation  Patient identified by MRN, date of birth, ID band Patient awake    Reviewed: Allergy & Precautions, NPO status , Patient's Chart, lab work & pertinent test results, reviewed documented beta blocker date and time   History of Anesthesia Complications (+) PONV and history of anesthetic complications  Airway Mallampati: II  TM Distance: >3 FB     Dental  (+) Chipped   Pulmonary           Cardiovascular      Neuro/Psych Anxiety    GI/Hepatic   Endo/Other    Renal/GU      Musculoskeletal   Abdominal   Peds  Hematology   Anesthesia Other Findings Hypertensive this am.  Reproductive/Obstetrics                             Anesthesia Physical Anesthesia Plan  ASA: II  Anesthesia Plan: General   Post-op Pain Management:    Induction: Intravenous  PONV Risk Score and Plan:   Airway Management Planned: LMA  Additional Equipment:   Intra-op Plan:   Post-operative Plan:   Informed Consent: I have reviewed the patients History and Physical, chart, labs and discussed the procedure including the risks, benefits and alternatives for the proposed anesthesia with the patient or authorized representative who has indicated his/her understanding and acceptance.       Plan Discussed with: CRNA  Anesthesia Plan Comments:         Anesthesia Quick Evaluation

## 2018-07-25 NOTE — Interval H&P Note (Signed)
History and Physical Interval Note:  07/25/2018 1200  Tabitha Tucker  has presented today for surgery, with the diagnosis of LEFT BREAST CANCER  The various methods of treatment have been discussed with the patient and family. After consideration of risks, benefits and other options for treatment, the patient has consented to  Procedure(s): BREAST LUMPECTOMY WITH NEEDLE LOCALIZATION AND SENTINEL NODE BX (Left) as a surgical intervention .  The patient's history has been reviewed, patient examined, no change in status, stable for surgery.  I have reviewed the patient's chart and labs.  Questions were answered to the patient's satisfaction.     [place] Wherry Lysle Pearl

## 2018-07-25 NOTE — Discharge Instructions (Addendum)
Breast Biopsy, Care After These instructions give you information about caring for yourself after your procedure. Your doctor may also give you more specific instructions. Call your doctor if you have any problems or questions after your procedure. What can I expect after the procedure? After your procedure, it is common to have:  Bruising on your breast.  Numbness, tingling, or pain near your biopsy site. Follow these instructions at home: Medicines  Take over-the-counter and prescription medicines only as told by your doctor.  Do not drive for 24 hours if you were given a medicine to help you relax (sedative) during your procedure.  Do not drink alcohol while taking pain medicine.  Do not drive or use heavy machinery while taking prescription pain medicine. Biopsy site care      Follow instructions from your doctor about how to take care of your cut from surgery (incision) or your puncture area. Make sure you: ? Wash your hands with soap and water before you change your bandage (dressing). If you cannot use soap and water, use hand sanitizer. ? Change your bandage as told by your doctor. ? Leave stitches (sutures), skin glue, or skin tape (adhesive strips) in place. They may need to stay in place for 2 weeks or longer. If tape strips get loose and curl up, you may trim the loose edges. Do not remove tape strips completely unless your doctor says it is okay.  If you have stitches, keep them dry when you take a bath or a shower.  Check your cut or puncture area every day for signs of infection. Check for: ? Redness, swelling, or pain. ? Fluid or blood. ? Warmth. ? Pus or a bad smell.  Protect the biopsy area. Do not let the area get bumped. Activity  If you had a cut during your procedure, avoid activities that could pull your cut open. These include: ? Stretching. ? Reaching over your head. ? Exercise. ? Sports. ? Lifting anything that weighs more than 3 lb (1.4  kg).  Return to your normal activities as told by your doctor. Ask your doctor what activities are safe for you. Managing pain, stiffness, and swelling If told, put ice on the biopsy site to relieve swelling:  Put ice in a plastic bag.  Place a towel between your skin and the bag.  Leave the ice on for 20 minutes, 2-3 times a day. General instructions  Continue your normal diet.  Wear a good support bra for as long as told by your doctor.  Get checked for extra fluid around your lymph nodes (lymphedema) as often as told by your doctor.  Keep all follow-up visits as told by your doctor. This is important. Contact a doctor if:  You notice any of the following at the biopsy site: ? More redness, swelling, or pain. ? More fluid or blood coming from the site. ? The site feels warm to the touch. ? Pus or a bad smell coming from the site. ? The site breaks open after the stitches or skin tape strips have been removed.  You have a rash.  You have a fever. Get help right away if:  You have more bleeding from the biopsy site. Get help right away if bleeding is more than a small spot.  You have trouble breathing.  You have red streaks around the biopsy site. Summary  After your procedure, it is common to have bruising, numbness, tingling, or pain near the biopsy site.  Do not drive  or use heavy machinery while taking prescription pain medicine.  Wear a good support bra for as long as told by your doctor.  If you had a cut during your procedure, avoid activities that may pull the cut open. Ask your doctor what activities are safe for you. This information is not intended to replace advice given to you by your health care provider. Make sure you discuss any questions you have with your health care provider. Document Released: 04/15/2009 Document Revised: 12/06/2017 Document Reviewed: 12/06/2017 Elsevier Interactive Patient Education  2019 [place]  After This sheet gives you information about how to care for yourself after your procedure. Your health care provider may also give you more specific instructions. If you have problems or questions, contact your health care provider. What can I expect after the procedure? After the procedure, it is common to have:  Breast swelling.  Breast tenderness.  Stiffness in your arm or shoulder.  A change in the shape and feel of your breast.  Scar tissue that feels hard to the touch in the area where the lump was removed. Follow these instructions at home: Medicines  Take over-the-counter and prescription medicines only as told by your health care provider.  Take your antibiotic medicine as told by your health care provider. Do not stop taking the antibiotic even if you start to feel better.  If you are taking prescription pain medicine, take actions to prevent or treat constipation. Your health care provider may recommend that you: ? Drink enough fluid to keep your urine pale yellow. ? Eat foods that are high in fiber, such as fresh fruits and vegetables, whole grains, and beans. ? Limit foods that are high in fat and processed sugars, such as fried and sweet foods. ? Take an over-the-counter or prescription medicine for constipation. Bathing  Do not take baths, swim, or use a hot tub until your health care provider approves. Ask your health care provider if you may take showers. You may only be allowed to take sponge baths. Incision care      Follow instructions from your health care provider about how to take care of your incision. Make sure you: ? Wash your hands with soap and water before you change your bandage (dressing). If soap and water are not available, use hand sanitizer. ? Change your dressing as told by your health care provider. ? Leave stitches (sutures), skin glue, or adhesive strips in place. These skin closures may need to stay in place for 2 weeks or longer. If  adhesive strip edges start to loosen and curl up, you may trim the loose edges. Do not remove adhesive strips completely unless your health care provider tells you to do that.  Check your incision area every day for signs of infection. Check for: ? Redness, swelling, or pain. ? Fluid or blood. ? Warmth. ? Pus or a bad smell.  Keep your dressing clean and dry.  If you were sent home with a surgical drain in place, follow instructions from your health care provider about emptying it. Activity  Return to your normal activities as told by your health care provider. Ask your health care provider what activities are safe for you.  Be careful to avoid any activities that could cause an injury to your arm on the side of your surgery.  Do not lift anything that is heavier than 10 lb (4.5 kg), or the limit that you are told, until your health care provider says  that it is safe. Avoid lifting with the arm that is on the side of your surgery.  Do not carry heavy objects on your shoulder on the side of your surgery.  After your drain is removed, you should perform exercises to keep your arm from getting stiff and swollen. Talk with your health care provider about which exercises are safe for you. General instructions  Do not drive or use heavy machinery while taking prescription pain medicine.  Wear a supportive bra as told by your health care provider.  Raise (elevate) your arm above the level of your heart while you are sitting or lying down.  Do not wear tight jewelry on your arm, wrist, or fingers on the side of your surgery.  Keep all follow-up visits as told by your health care provider. This is important. ? You may need to be screened for extra fluid around the lymph nodes (lymphedema). Follow instructions from your health care provider about how often you should be checked.  If you had any lymph nodes removed during your procedure, be sure to tell all of your health care providers. This  is important information to share before you are involved in certain procedures, such as having blood tests or having your blood pressure taken. Contact a health care provider if:  You develop a rash.  You have a fever.  Your pain medicine is not working.  Your swelling, weakness, or numbness in your arm has not improved after a few weeks.  You have new swelling in your breast or arm.  You have redness, swelling, or pain in your incision area.  You have fluid or blood coming from your incision.  Your incision feels warm to the touch.  You have pus or a bad smell coming from your incision. Get help right away if:  You have very bad pain in your breast or arm.  You have chest pain.  You have difficulty breathing. Summary  After the procedure, it is common to have breast tenderness, swelling, and stiffness in your arm and shoulder.  Follow instructions from your health care provider about how to take care of your incision.  Do not lift anything that is heavier than 10 lb (4.5 kg), or the limit that you are told, until your health care provider says that it is safe. Avoid lifting with the arm that is on the side of your surgery.  If you had any lymph nodes removed during your procedure, be sure to tell all of your health care providers. This is important information to share before you are involved in certain procedures, such as having blood tests or having your blood pressure taken. This information is not intended to replace advice given to you by your health care provider. Make sure you discuss any questions you have with your health care provider. Document Released: 07/05/2006 Document Revised: 10/16/2017 Document Reviewed: 03/01/2016 Elsevier Interactive Patient Education  2019 [employer].

## 2018-07-25 NOTE — Op Note (Signed)
Preoperative diagnosis:  left breast carcinoma.  Postoperative diagnosis: same.   Procedure: needle-localized left breast partial mastectomy.                      left Axillary Sentinel Lymph node biopsy  Anesthesia: GETA  Surgeon: Dr. Benjamine Sprague  Wound Classification: Clean  Indications: Patient is a 58 y.o. female with a nonpalpable left breast mass noted on mammography with core biopsy demonstrating mammary CA.   requires needle-localized partial mastectomy for treatment with sentinel lymph node biopsy.   Specimen: left Breast mass, Sentinel Lymph nodes x 2, superior lateral margin  Complications: None  Estimated Blood Loss: 83mL  Findings: 1. Specimen mammography shows marker and wire on specimen 2. Pathology call refers gross examination of margins was negative, but close to the superior-lateral portion 3. No other palpable mass or lymph node identified.   Description of procedure: Preoperative needle localization was performed by radiology. In the nuclear medicine suite, the subareolar region was injected with Tc-99 sulfur colloid. Localization studies were reviewed. The patient was taken to the operating room and placed supine on the operating table, and after general anesthesia the left breast and axilla were prepped and draped in the usual sterile fashion. A time-out was completed verifying correct patient, procedure, site, positioning, and implant(s) and/or special equipment prior to beginning this procedure.  By comparing the localization studies with the direction and skin entry site of the needle, the probable trajectory and location of the mass was visualized. A skin incision was planned in such a way as to minimize the amount of dissection to reach the mass.  The skin incision was made after infusion of local. Flaps were raised and the location of the wire confirmed. The wire was delivered into the wound. Sharp and blunt dissection was then taken down to the mass within  breast tissue, measuring 5cm x 4cm x 4cm, taking care to include the entire localizing needle and a margin of grossly normal tissue. The specimen and entire localizing wire were removed. The specimen was oriented with long lateral, short superior, deep double sutures and sent to radiology with the localization studies. Confirmation was received that the entire target lesion had been resected, but superior lateral margin about 20mm on gross examination.  Additional tissue removed in superior lateral margin in case, marked identically as mass, and sent to pathology.  A hand-held gamma probe was used to identify the location of the hottest spot in the axilla. An incision was made around the caudal axillary hairline. Sharp and blunt Dissection was carried down to subdermal facias. The probe was placed within wound and again, the point of maximal count was found. Dissection continue until nodule was identified. The probe was placed in contact with the node and average of 300 counts were recorded. The node was excised in its entirety. Ex vivo, the node measured 300 counts when placed on the probe. The bed of the node measured 20 counts. An additional hot spot was detected and the node was excised in similar fashion, measuring 250s. No additional hot spots were identified. No clinically abnormal nodes were palpated. Both wounds irrigated, hemostasis was achieved and the wound closed in layers with  interrupted sutures of 3-0 Vicryl in deep dermal layer and a running subcuticular suture of Monocryl 4-0, then dressed with dermabond. The patient tolerated the procedure well and was taken to the postanesthesia care unit in stable condition. Sponge and instrument count correct at end of procedure.

## 2018-07-25 NOTE — Anesthesia Postprocedure Evaluation (Signed)
Anesthesia Post Note  Patient: Tabitha Tucker  Procedure(s) Performed: BREAST LUMPECTOMY WITH NEEDLE LOCALIZATION AND SENTINEL NODE BX (Left )  Patient location during evaluation: PACU Anesthesia Type: General Level of consciousness: awake and alert Pain management: pain level controlled Vital Signs Assessment: post-procedure vital signs reviewed and stable Respiratory status: spontaneous breathing, nonlabored ventilation, respiratory function stable and patient connected to nasal cannula oxygen Cardiovascular status: blood pressure returned to baseline and stable Postop Assessment: no apparent nausea or vomiting Anesthetic complications: no     Last Vitals:  Vitals:   07/25/18 1357 07/25/18 1453  BP: (!) 154/75 125/79  Pulse: 86   Resp:  16  Temp: 36.7 C 36.7 C  SpO2: 95% 98%    Last Pain:  Vitals:   07/25/18 1453  TempSrc: Temporal  PainSc: 0-No pain                 Yaretzi Ernandez S

## 2018-07-29 LAB — SURGICAL PATHOLOGY

## 2018-07-30 ENCOUNTER — Encounter: Payer: Self-pay | Admitting: Oncology

## 2018-07-30 ENCOUNTER — Inpatient Hospital Stay (HOSPITAL_BASED_OUTPATIENT_CLINIC_OR_DEPARTMENT_OTHER): Payer: BC Managed Care – PPO | Admitting: Oncology

## 2018-07-30 VITALS — BP 130/85 | HR 50 | Temp 98.3°F | Resp 18 | Ht 64.0 in | Wt 140.6 lb

## 2018-07-30 DIAGNOSIS — Z7189 Other specified counseling: Secondary | ICD-10-CM

## 2018-07-30 DIAGNOSIS — C779 Secondary and unspecified malignant neoplasm of lymph node, unspecified: Secondary | ICD-10-CM

## 2018-07-30 DIAGNOSIS — C50412 Malignant neoplasm of upper-outer quadrant of left female breast: Secondary | ICD-10-CM | POA: Diagnosis not present

## 2018-07-30 DIAGNOSIS — Z170 Estrogen receptor positive status [ER+]: Secondary | ICD-10-CM | POA: Diagnosis not present

## 2018-07-30 NOTE — Progress Notes (Signed)
Patient under arm on left numb from procedure.

## 2018-08-01 NOTE — Progress Notes (Addendum)
Hematology/Oncology Consult note [facility]  [phone] [phone]  Patient Care Team: Ward, Honor Loh, MD as PCP - General (Obstetrics and Gynecology)   Name of the patient: Tabitha Tucker  [id number]  1960/11/27   Date of visit: 08/01/18  Diagnosis-pathological prognostic stage I A p T1c p N1MX invasive mammary carcinoma of the left breast ER PR positive HER-2/neu negative  Chief complaint/ Reason for visit-discuss final pathology results and further management  Heme/Onc history: patient is a 58 year old Caucasian female who recently underwent a screening mammogram which showed 1.7 x 1.2 x 1.6 cm hypoechoic irregular area in her left breast at 1 o'clock position.  No evidence of left axillary adenopathy.  This was 5 biopsied and was invasive mammary carcinoma grade 2, ER greater than 90% positive PR 11 to 50% positive and HER-2/neu negative.  Patient will be seeing surgery tomorrow.  She has never had prior breast biopsies.  She is G2 P2 L2.  She has had a hysterectomy many years ago.  No significant family history of breast cancer, ovarian cancer, colon gastric or pancreatic cancer.   Patient underwent lumpectomy with sentinel lymph node biopsy on 07/25/2018.  Final pathology showed invasive mammary carcinoma 1.7 cm, grade 2 with negative margins.  2 out of 2 sentinel lymph nodes were positive for malignancy.  One lymph node was involved by micrometastatic carcinoma 1.5 mm and the other lymph node was involved with macro metastatic disease 5 mm.  There was focal microscopy extracapsular extension present which I verified with Dr. Reuel Derby from pathology.  pT1c pN1A  Interval history-overall she is doing well since surgery and reports some soreness at the site of lumpectomy but denies other complaints at this time  ECOG PS- 0 Pain scale- 0 Opioid associated constipation- no  Review of systems- Review of Systems  Constitutional: Negative for  chills, fever, malaise/fatigue and weight loss.  HENT: Negative for congestion, ear discharge and nosebleeds.   Eyes: Negative for blurred vision.  Respiratory: Negative for cough, hemoptysis, sputum production, shortness of breath and wheezing.   Cardiovascular: Negative for chest pain, palpitations, orthopnea and claudication.  Gastrointestinal: Negative for abdominal pain, blood in stool, constipation, diarrhea, heartburn, melena, nausea and vomiting.  Genitourinary: Negative for dysuria, flank pain, frequency, hematuria and urgency.  Musculoskeletal: Negative for back pain, joint pain and myalgias.  Skin: Negative for rash.  Neurological: Negative for dizziness, tingling, focal weakness, seizures, weakness and headaches.  Endo/Heme/Allergies: Does not bruise/bleed easily.  Psychiatric/Behavioral: Negative for depression and suicidal ideas. The patient does not have insomnia.        No Known Allergies   Past Medical History:  Diagnosis Date  . Breast cancer ([place])   . High cholesterol   . Panic attacks   . PONV (postoperative nausea and vomiting)      Past Surgical History:  Procedure Laterality Date  . ABDOMINAL HYSTERECTOMY     pt did keep ovaries  . APPENDECTOMY     ate age 89  . BREAST BIOPSY Left 07/10/2018   INVASIVE MAMMARY CARCINOMA  . BREAST LUMPECTOMY Left 07/25/2018   [place],   . BREAST LUMPECTOMY WITH NEEDLE LOCALIZATION AND AXILLARY SENTINEL LYMPH NODE BX Left 07/25/2018   Procedure: BREAST LUMPECTOMY WITH NEEDLE LOCALIZATION AND SENTINEL NODE BX;  Surgeon: Benjamine Sprague, DO;  Location: [facility] ORS;  Service: General;  Laterality: Left;    Social History   Socioeconomic History  . Marital status: Married    Spouse name: Not  on file  . Number of children: Not on file  . Years of education: Not on file  . Highest education level: Not on file  Occupational History  . Not on file  Social Needs  . Financial resource strain: Not on file  . Food insecurity:     Worry: Not on file    Inability: Not on file  . Transportation needs:    Medical: Not on file    Non-medical: Not on file  Tobacco Use  . Smoking status: Never Smoker  . Smokeless tobacco: Never Used  Substance and Sexual Activity  . Alcohol use: Yes    Alcohol/week: 14.0 standard drinks    Types: 14 Glasses of wine per week    Comment: 2 glasses of wine every day  . Drug use: Not Currently    Types: Marijuana    Comment: marijuana when she was a teenager a few time  . Sexual activity: Yes  Lifestyle  . Physical activity:    Days per week: Not on file    Minutes per session: Not on file  . Stress: Not on file  Relationships  . Social connections:    Talks on phone: Not on file    Gets together: Not on file    Attends religious service: Not on file    Active member of club or organization: Not on file    Attends meetings of clubs or organizations: Not on file    Relationship status: Not on file  . Intimate partner violence:    Fear of current or ex partner: Not on file    Emotionally abused: Not on file    Physically abused: Not on file    Forced sexual activity: Not on file  Other Topics Concern  . Not on file  Social History Narrative  . Not on file    Family History  Problem Relation Age of Onset  . Hypertension Father   . Breast cancer Neg Hx      Current Outpatient Medications:  .  acetaminophen (TYLENOL) 325 MG tablet, Take 2 tablets (650 mg total) by mouth every 8 (eight) hours as needed for up to 30 days for mild pain., Disp: 40 tablet, Rfl: 0 .  ALPRAZolam (XANAX) 0.25 MG tablet, Take 0.25 mg by mouth 2 (two) times daily as needed for anxiety., Disp: , Rfl:  .  B Complex-C (SUPER B COMPLEX PO), Take 1 tablet by mouth daily., Disp: , Rfl:  .  Biotin w/ Vitamins C & E (HAIR/SKIN/NAILS PO), Take 1 tablet by mouth daily., Disp: , Rfl:  .  Coenzyme Q10 (CO Q-10) 100 MG CAPS, Take 100 mg by mouth daily., Disp: , Rfl:  .  Garlic 9476 MG CAPS, Take 1,000 mg by  mouth daily., Disp: , Rfl:  .  ibuprofen (ADVIL,MOTRIN) 800 MG tablet, Take 1 tablet (800 mg total) by mouth every 8 (eight) hours as needed for mild pain or moderate pain., Disp: 30 tablet, Rfl: 0 .  SELENIUM PO, Take 250 mcg by mouth daily., Disp: , Rfl:  .  simvastatin (ZOCOR) 10 MG tablet, Take 10 mg by mouth daily., Disp: , Rfl:   Physical exam:  Vitals:   07/30/18 1333  BP: 130/85  Pulse: (!) 50  Resp: 18  Temp: 98.3 F (36.8 C)  TempSrc: Tympanic  Weight: 140 lb 9.6 oz (63.8 kg)  Height: '5\' 4"'  (1.626 m)   Physical Exam Constitutional:      General: She is not in  acute distress. HENT:     Head: Normocephalic and atraumatic.  Eyes:     Pupils: Pupils are equal, round, and reactive to light.  Neck:     Musculoskeletal: Normal range of motion.  Cardiovascular:     Rate and Rhythm: Normal rate and regular rhythm.     Heart sounds: Normal heart sounds.  Pulmonary:     Effort: Pulmonary effort is normal.     Breath sounds: Normal breath sounds.  Abdominal:     General: Bowel sounds are normal.     Palpations: Abdomen is soft.  Skin:    General: Skin is warm and dry.  Neurological:     Mental Status: She is alert and oriented to person, place, and time.      CMP Latest Ref Rng & Units 09/10/2013  Glucose 65 - 99 mg/dL 87  BUN 7 - 18 mg/dL 11  Creatinine 0.60 - 1.30 mg/dL 0.85  Sodium 136 - 145 mmol/L 139  Potassium 3.5 - 5.1 mmol/L 4.1  Chloride 98 - 107 mmol/L 106  CO2 21 - 32 mmol/L 28  Calcium 8.5 - 10.1 mg/dL 9.9  Total Protein 6.4 - 8.2 g/dL 8.6(H)  Total Bilirubin 0.2 - 1.0 mg/dL 0.6  Alkaline Phos Unit/L 82  AST 15 - 37 Unit/L 24  ALT 12 - 78 U/L 18   CBC Latest Ref Rng & Units 09/10/2013  WBC 3.6 - 11.0 x10 3/mm 3 5.8  Hemoglobin 12.0 - 16.0 g/dL 15.1  Hematocrit 35.0 - 47.0 % 44.3  Platelets 150 - 440 x10 3/mm 3 282    No images are attached to the encounter.  Nm Sentinel Node Injection  Result Date: 07/25/2018 CLINICAL DATA:  Left breast  cancer. EXAM: NUCLEAR MEDICINE BREAST LYMPHOSCINTIGRAPHY TECHNIQUE: Intradermal injection of radiopharmaceutical was performed at the 12 o'clock, 3 o'clock, 6 o'clock, and 9 o'clock positions around the left nipple. The patient was then sent to the operating room where the sentinel node(s) were identified and removed by the surgeon. RADIOPHARMACEUTICALS:  Total of 0.665 mCi Millipore-filtered Technetium-36msulfur colloid, injected in four aliquots. IMPRESSION: Uncomplicated intradermal injection of a total of 0.665 mCi Technetium-975mulfur colloid for purposes of sentinel node identification. Electronically Signed   By: AdMarkus Daft.D.   On: 07/25/2018 10:14   Mm Breast Surgical Specimen  Result Date: 07/25/2018 CLINICAL DATA:  89ear old female presenting for left breast lumpectomy. EXAM: SPECIMEN RADIOGRAPH OF THE LEFT BREAST COMPARISON:  Previous exam(s). FINDINGS: Status post excision of the left breast. The wire tip and biopsy marker clip are present and are marked for pathology. IMPRESSION: Specimen radiograph of the left breast. Electronically Signed   By: SeKristopher Oppenheim.D.   On: 07/25/2018 12:15   UsKoreareast Ltd Uni Left Inc Axilla  Result Date: 07/05/2018 CLINICAL DATA:  Left breast upper outer quadrant mass/distortion seen on most recent screening mammography. EXAM: DIGITAL DIAGNOSTIC LEFT MAMMOGRAM WITH CAD AND TOMO ULTRASOUND LEFT BREAST COMPARISON:  Previous exam(s). ACR Breast Density Category b: There are scattered areas of fibroglandular density. FINDINGS: Additional mammographic views of the left breast demonstrate persistent distortion in the left breast upper outer quadrant, middle depth. Mammographic images were processed with CAD. On physical exam, subtle palpable abnormalities detected in the left 1 o'clock breast 5 cm from the nipple. Targeted ultrasound is performed, showing left breast 1 o'clock 5 cm from the nipple a hypoechoic irregular shadowing area which measures 1.7 by 1.2  by 1.6 cm. There is increased vascularity to  the periphery of the lesion. This finding likely corresponds to the distortion seen mammographically. There is no evidence of left axillary lymphadenopathy. IMPRESSION: Left breast 1 o'clock suspicious area of architectural distortion/sonographic irregular shadowing area, for which ultrasound-guided core needle biopsy is recommended. RECOMMENDATION: Ultrasound-guided core needle biopsy of the left breast. I have discussed the findings and recommendations with the patient. Results were also provided in writing at the conclusion of the visit. If applicable, a reminder letter will be sent to the patient regarding the next appointment. BI-RADS CATEGORY  4: Suspicious. Electronically Signed   By: Fidela Salisbury M.D.   On: 07/05/2018 17:32   Mm Diag Breast Tomo Uni Left  Result Date: 07/05/2018 CLINICAL DATA:  Left breast upper outer quadrant mass/distortion seen on most recent screening mammography. EXAM: DIGITAL DIAGNOSTIC LEFT MAMMOGRAM WITH CAD AND TOMO ULTRASOUND LEFT BREAST COMPARISON:  Previous exam(s). ACR Breast Density Category b: There are scattered areas of fibroglandular density. FINDINGS: Additional mammographic views of the left breast demonstrate persistent distortion in the left breast upper outer quadrant, middle depth. Mammographic images were processed with CAD. On physical exam, subtle palpable abnormalities detected in the left 1 o'clock breast 5 cm from the nipple. Targeted ultrasound is performed, showing left breast 1 o'clock 5 cm from the nipple a hypoechoic irregular shadowing area which measures 1.7 by 1.2 by 1.6 cm. There is increased vascularity to the periphery of the lesion. This finding likely corresponds to the distortion seen mammographically. There is no evidence of left axillary lymphadenopathy. IMPRESSION: Left breast 1 o'clock suspicious area of architectural distortion/sonographic irregular shadowing area, for which  ultrasound-guided core needle biopsy is recommended. RECOMMENDATION: Ultrasound-guided core needle biopsy of the left breast. I have discussed the findings and recommendations with the patient. Results were also provided in writing at the conclusion of the visit. If applicable, a reminder letter will be sent to the patient regarding the next appointment. BI-RADS CATEGORY  4: Suspicious. Electronically Signed   By: Fidela Salisbury M.D.   On: 07/05/2018 17:32   Mm Clip Placement Left  Result Date: 07/10/2018 CLINICAL DATA:  Status post ultrasound-guided core biopsy of the left breast. EXAM: DIAGNOSTIC LEFT MAMMOGRAM POST ULTRASOUND BIOPSY COMPARISON:  Previous exam(s). FINDINGS: Mammographic images were obtained following ultrasound guided biopsy of the left breast. Mammographic images show there is a venous shaped clip in appropriate position in the upper-outer quadrant of the left breast. IMPRESSION: Status post ultrasound-guided core biopsy of the left breast with pathology pending. Final Assessment: Post Procedure Mammograms for Marker Placement Electronically Signed   By: Lillia Mountain M.D.   On: 07/10/2018 13:56   [place] Lt Breast Bx W Loc Dev 1st Lesion Img Bx Spec US Guide  Addendum Date: 07/12/2018   ADDENDUM REPORT: 07/12/2018 08:57 ADDENDUM: Invasive mammary carcinoma was reported histologically. This corresponds well with the imaging findings. Patient was contacted by telephone given the results of the biopsy. She states the wound site is clean and dry with no hematoma or signs of infection. The patient will be contacted by the nurse navigator to set up surgical consultation. Electronically Signed   By: Lillia Mountain M.D.   On: 07/12/2018 08:57   Result Date: 07/12/2018 CLINICAL DATA:  Suspicious left breast mass. EXAM: ULTRASOUND GUIDED LEFT BREAST CORE NEEDLE BIOPSY COMPARISON:  Previous exam(s). FINDINGS: I met with the patient and we discussed the procedure of ultrasound-guided biopsy, including  benefits and alternatives. We discussed the high likelihood of a successful procedure. We discussed the risks  of the procedure, including infection, bleeding, tissue injury, clip migration, and inadequate sampling. Informed written consent was given. The usual time-out protocol was performed immediately prior to the procedure. Lesion quadrant: Upper-outer quadrant Using sterile technique and 1% Lidocaine as local anesthetic, under direct ultrasound visualization, a 12 gauge spring-loaded device was used to perform biopsy of a mass in the 1 o'clock region of the left breast using a lateral to medial approach. At the conclusion of the procedure a venous shaped tissue marker clip was deployed into the biopsy cavity. Follow up 2 view mammogram was performed and dictated separately. IMPRESSION: Ultrasound guided biopsy of the left breast. No apparent complications. Electronically Signed: By: Lillia Mountain M.D. On: 07/10/2018 13:34   Mm Lt Plc Breast Loc Dev   1st Lesion  Inc Mammo Guide  Result Date: 07/25/2018 CLINICAL DATA:  58 year old female with biopsy proven invasive mammary carcinoma of the left breast. EXAM: NEEDLE LOCALIZATION OF THE LEFT BREAST WITH MAMMO GUIDANCE COMPARISON:  Previous exams. FINDINGS: Patient presents for needle localization prior to left breast lumpectomy. I met with the patient and we discussed the procedure of needle localization including benefits and alternatives. We discussed the high likelihood of a successful procedure. We discussed the risks of the procedure, including infection, bleeding, tissue injury, and further surgery. Informed, written consent was given. The usual time-out protocol was performed immediately prior to the procedure. Using mammographic guidance, sterile technique, 1% lidocaine and a 7 cm modified Kopans needle, a post biopsy clip at the site of the patient's distortion was localized using superior approach. The images were marked for Dr. Lysle Pearl. IMPRESSION:  Needle localization left breast. No apparent complications. Electronically Signed   By: Kristopher Oppenheim M.D.   On: 07/25/2018 09:07     Assessment and plan- Patient is a 58 y.o. female with newly diagnosed pathological prognostic stage Ia invasive mammary carcinoma pT1 cpN1 aMX ER greater than 90% positive, PR 51 to 90% positive and HER-2/neu negative  I discussed the results of final pathology with the patient in detail.  All the patient had a relatively small size of breast mass 1.7 cm and was strongly estrogen and progesterone positive she did have 2 lymph node involved 1 micrometastatic and macro metastatic.  Discussed that given she she is postmenopausal with a strongly estrogen positive tumor it would be reasonable to obtain a MammaPrint in a clinically high risk case such as her to decide if she would benefit from adjuvant chemotherapy.  MammaPrint assay has been studied and lymph node positive patients up to 3+ lymph nodes.  If MammaPrint score comes back as high risk she would benefit from adjuvant chemotherapy and I would recommend 4 cycles of dose dense AC followed by 12 weekly cycles of Taxol which I will discuss in greater detail with her after the results are back.  If MammaPrint comes back as low risk I would refer her to radiation treatment and discuss hormone therapy with her which would start upon completion of radiation treatment.  Treatment will be given with a curative intent  We also discussed her extracapsular extension which was seen on her pathology which was focal and microscopic and therefore she does not require axillary lymph node dissection which requires gross ECE.  She will definitely benefit from adjuvant radiation therapy to her left breast as well as her lymph nodes.   given that she had 2/2 sentinel LN positive along with extra capsular extension, I would like to get further imaging CT chest  abdomen pelvis with contrast to look for distant metastatic disease especially  given the ECE.  Cancer Staging Malignant neoplasm of left female breast [facility]) Staging form: Breast, AJCC 8th Edition - Clinical stage from 07/16/2018: Stage IA (cT1c, cN0, cM0, G2, ER+, PR+, HER2-) - Signed by Sindy Guadeloupe, MD on 07/18/2018 - Pathologic: Stage IA (pT1c, pN1a, cM0, G2, ER+, PR+, HER2-) - Signed by Sindy Guadeloupe, MD on 08/01/2018     Total face to face encounter time for this patient visit was 40 min. >50% of the time was  spent in counseling and coordination of care.     Visit Diagnosis 1. Malignant neoplasm of upper-outer quadrant of left breast in female, estrogen receptor positive ([place])   2. Goals of care, counseling/discussion      Dr. Randa Evens, MD, MPH [facility] [phone] 08/01/2018 4:04 PM

## 2018-08-05 ENCOUNTER — Telehealth: Payer: Self-pay | Admitting: *Deleted

## 2018-08-05 NOTE — Telephone Encounter (Signed)
Called and made patient aware of the R/S 08/12/18 CT ABD/PEL Patient is aware of the scheduled appt- location, date and time.

## 2018-08-06 ENCOUNTER — Ambulatory Visit: Admission: RE | Admit: 2018-08-06 | Payer: BC Managed Care – PPO | Source: Ambulatory Visit

## 2018-08-07 ENCOUNTER — Telehealth: Payer: Self-pay | Admitting: *Deleted

## 2018-08-07 NOTE — Telephone Encounter (Signed)
Called patient to let her know that we are working on trying to get ct scan approved with her insurance. rcvd a letter that [employer] for [employer] is working on it and they will respond to our additional information within 7 days from 08/06/2018 with response of approved or not. I told pt. That we will contact her about when we get a response. She has surgery appt on 2/7 at 10:45 and I asked her to call me on my cell phone after the visit because if we do not have an answer then we will need to cancel the ct for 2/10. She is agreeable to the plan and she was going to pick up the prep kit after the surgeon appt this Friday.

## 2018-08-08 ENCOUNTER — Encounter: Payer: Self-pay | Admitting: Oncology

## 2018-08-09 ENCOUNTER — Telehealth: Payer: Self-pay | Admitting: *Deleted

## 2018-08-09 DIAGNOSIS — C50911 Malignant neoplasm of unspecified site of right female breast: Secondary | ICD-10-CM | POA: Insufficient documentation

## 2018-08-09 NOTE — Telephone Encounter (Signed)
Called to let patient know that the CT scan has been denied from her insurance.  Dr. Janese Banks suspected that it might have been because she was only a stage I breast cancer but we wanted to look and see if there is anything outstanding due to the positive lymph nodes that she had at.  Patient will come in next week and get results of her MammaPrint and we will go from there.  Patient understands and will be at her appointment next week.

## 2018-08-12 ENCOUNTER — Ambulatory Visit: Payer: BC Managed Care – PPO

## 2018-08-13 ENCOUNTER — Encounter: Payer: Self-pay | Admitting: Oncology

## 2018-08-13 ENCOUNTER — Inpatient Hospital Stay: Payer: BC Managed Care – PPO | Attending: Oncology | Admitting: Oncology

## 2018-08-13 ENCOUNTER — Other Ambulatory Visit: Payer: Self-pay | Admitting: *Deleted

## 2018-08-13 VITALS — BP 146/86 | HR 102 | Temp 98.1°F | Resp 16 | Wt 138.2 lb

## 2018-08-13 DIAGNOSIS — C779 Secondary and unspecified malignant neoplasm of lymph node, unspecified: Secondary | ICD-10-CM

## 2018-08-13 DIAGNOSIS — C50412 Malignant neoplasm of upper-outer quadrant of left female breast: Secondary | ICD-10-CM

## 2018-08-13 DIAGNOSIS — Z791 Long term (current) use of non-steroidal anti-inflammatories (NSAID): Secondary | ICD-10-CM | POA: Insufficient documentation

## 2018-08-13 DIAGNOSIS — Z7189 Other specified counseling: Secondary | ICD-10-CM

## 2018-08-13 DIAGNOSIS — Z79899 Other long term (current) drug therapy: Secondary | ICD-10-CM | POA: Insufficient documentation

## 2018-08-13 DIAGNOSIS — Z170 Estrogen receptor positive status [ER+]: Secondary | ICD-10-CM | POA: Insufficient documentation

## 2018-08-13 NOTE — Progress Notes (Signed)
Pt in for follow up and results.  Pt is "very anxious today".

## 2018-08-13 NOTE — Progress Notes (Signed)
Hematology/Oncology Consult note [facility]  [phone] [phone]  Patient Care Team: Ward, Honor Loh, MD as PCP - General (Obstetrics and Gynecology)   Name of the patient: Tabitha Tucker  [id number]  13-Nov-1960   Date of visit: 08/13/18  Diagnosis- pathological prognostic stage IA p T1c p N1MX invasive mammary carcinoma of the left breast ER PR positive HER-2/neu negative  Chief complaint/ Reason for visit-discuss MammaPrint results and further management  Heme/Onc history: patient is a 58 year old Caucasian female who recently underwent a screening mammogram which showed 1.7 x 1.2 x 1.6 cm hypoechoic irregular area in her left breast at 1 o'clock position. No evidence of left axillary adenopathy. This was 5 biopsied and was invasive mammary carcinoma grade 2, ER greater than 90% positive PR 11 to 50% positive and HER-2/neu negative. Patient will be seeing surgery tomorrow. She has never had prior breast biopsies. She is G2 P2 L2. She has had a hysterectomy many years ago. No significant family history of breast cancer, ovarian cancer, colon gastric or pancreatic cancer.  Patient underwent lumpectomy with sentinel lymph node biopsy on 07/25/2018.  Final pathology showed invasive mammary carcinoma 1.7 cm, grade 2 with negative margins.  2 out of 2 sentinel lymph nodes were positive for malignancy.  One lymph node was involved by micrometastatic carcinoma 1.5 mm and the other lymph node was involved with macro metastatic disease 5 mm.  There was focal microscopy extracapsular extension present which I verified with Dr. Reuel Derby from pathology.  pT1c pN1A.  Systemic CT scans denied by insurance  Patient underwent MammaPrint testing which came back as low risk with no significant benefit from adjuvant chemotherapy.  Interval history-patient is anxious to hear about the results of her mammogram testing.  Denies any specific complaints at this  time  ECOG PS- 0 Pain scale- 0   Review of systems- Review of Systems  Constitutional: Negative for chills, fever, malaise/fatigue and weight loss.  HENT: Negative for congestion, ear discharge and nosebleeds.   Eyes: Negative for blurred vision.  Respiratory: Negative for cough, hemoptysis, sputum production, shortness of breath and wheezing.   Cardiovascular: Negative for chest pain, palpitations, orthopnea and claudication.  Gastrointestinal: Negative for abdominal pain, blood in stool, constipation, diarrhea, heartburn, melena, nausea and vomiting.  Genitourinary: Negative for dysuria, flank pain, frequency, hematuria and urgency.  Musculoskeletal: Negative for back pain, joint pain and myalgias.  Skin: Negative for rash.  Neurological: Negative for dizziness, tingling, focal weakness, seizures, weakness and headaches.  Endo/Heme/Allergies: Does not bruise/bleed easily.  Psychiatric/Behavioral: Negative for depression and suicidal ideas. The patient does not have insomnia.       No Known Allergies   Past Medical History:  Diagnosis Date  . Breast cancer ([place])   . High cholesterol   . Panic attacks   . PONV (postoperative nausea and vomiting)      Past Surgical History:  Procedure Laterality Date  . ABDOMINAL HYSTERECTOMY     pt did keep ovaries  . APPENDECTOMY     ate age 10  . BREAST BIOPSY Left 07/10/2018   INVASIVE MAMMARY CARCINOMA  . BREAST LUMPECTOMY Left 07/25/2018   [place],   . BREAST LUMPECTOMY WITH NEEDLE LOCALIZATION AND AXILLARY SENTINEL LYMPH NODE BX Left 07/25/2018   Procedure: BREAST LUMPECTOMY WITH NEEDLE LOCALIZATION AND SENTINEL NODE BX;  Surgeon: Benjamine Sprague, DO;  Location: [facility] ORS;  Service: General;  Laterality: Left;    Social History   Socioeconomic History  .  Marital status: Married    Spouse name: Not on file  . Number of children: Not on file  . Years of education: Not on file  . Highest education level: Not on file  Occupational  History  . Not on file  Social Needs  . Financial resource strain: Not on file  . Food insecurity:    Worry: Not on file    Inability: Not on file  . Transportation needs:    Medical: Not on file    Non-medical: Not on file  Tobacco Use  . Smoking status: Never Smoker  . Smokeless tobacco: Never Used  Substance and Sexual Activity  . Alcohol use: Yes    Alcohol/week: 14.0 standard drinks    Types: 14 Glasses of wine per week    Comment: 2 glasses of wine every day  . Drug use: Not Currently    Types: Marijuana    Comment: marijuana when she was a teenager a few time  . Sexual activity: Yes  Lifestyle  . Physical activity:    Days per week: Not on file    Minutes per session: Not on file  . Stress: Not on file  Relationships  . Social connections:    Talks on phone: Not on file    Gets together: Not on file    Attends religious service: Not on file    Active member of club or organization: Not on file    Attends meetings of clubs or organizations: Not on file    Relationship status: Not on file  . Intimate partner violence:    Fear of current or ex partner: Not on file    Emotionally abused: Not on file    Physically abused: Not on file    Forced sexual activity: Not on file  Other Topics Concern  . Not on file  Social History Narrative  . Not on file    Family History  Problem Relation Age of Onset  . Hypertension Father   . Breast cancer Neg Hx      Current Outpatient Medications:  .  acetaminophen (TYLENOL) 325 MG tablet, Take 2 tablets (650 mg total) by mouth every 8 (eight) hours as needed for up to 30 days for mild pain., Disp: 40 tablet, Rfl: 0 .  ALPRAZolam (XANAX) 0.25 MG tablet, Take 0.25 mg by mouth 2 (two) times daily as needed for anxiety., Disp: , Rfl:  .  ibuprofen (ADVIL,MOTRIN) 800 MG tablet, Take 1 tablet (800 mg total) by mouth every 8 (eight) hours as needed for mild pain or moderate pain., Disp: 30 tablet, Rfl: 0 .  simvastatin (ZOCOR) 10  MG tablet, Take 10 mg by mouth daily., Disp: , Rfl:  .  B Complex-C (SUPER B COMPLEX PO), Take 1 tablet by mouth daily., Disp: , Rfl:  .  Biotin w/ Vitamins C & E (HAIR/SKIN/NAILS PO), Take 1 tablet by mouth daily., Disp: , Rfl:  .  Coenzyme Q10 (CO Q-10) 100 MG CAPS, Take 100 mg by mouth daily., Disp: , Rfl:  .  Garlic 6803 MG CAPS, Take 1,000 mg by mouth daily., Disp: , Rfl:  .  SELENIUM PO, Take 250 mcg by mouth daily., Disp: , Rfl:   Physical exam:  Vitals:   08/13/18 1141  BP: (!) 146/86  Pulse: (!) 102  Resp: 16  Temp: 98.1 F (36.7 C)  TempSrc: Tympanic  Weight: 138 lb 4 oz (62.7 kg)   Physical Exam Constitutional:      General:  She is not in acute distress. HENT:     Head: Normocephalic and atraumatic.  Eyes:     Pupils: Pupils are equal, round, and reactive to light.  Neck:     Musculoskeletal: Normal range of motion.  Cardiovascular:     Rate and Rhythm: Normal rate and regular rhythm.     Heart sounds: Normal heart sounds.  Pulmonary:     Effort: Pulmonary effort is normal.     Breath sounds: Normal breath sounds.  Abdominal:     General: Bowel sounds are normal.     Palpations: Abdomen is soft.  Skin:    General: Skin is warm and dry.  Neurological:     Mental Status: She is alert and oriented to person, place, and time.      CMP Latest Ref Rng & Units 09/10/2013  Glucose 65 - 99 mg/dL 87  BUN 7 - 18 mg/dL 11  Creatinine 0.60 - 1.30 mg/dL 0.85  Sodium 136 - 145 mmol/L 139  Potassium 3.5 - 5.1 mmol/L 4.1  Chloride 98 - 107 mmol/L 106  CO2 21 - 32 mmol/L 28  Calcium 8.5 - 10.1 mg/dL 9.9  Total Protein 6.4 - 8.2 g/dL 8.6(H)  Total Bilirubin 0.2 - 1.0 mg/dL 0.6  Alkaline Phos Unit/L 82  AST 15 - 37 Unit/L 24  ALT 12 - 78 U/L 18   CBC Latest Ref Rng & Units 09/10/2013  WBC 3.6 - 11.0 x10 3/mm 3 5.8  Hemoglobin 12.0 - 16.0 g/dL 15.1  Hematocrit 35.0 - 47.0 % 44.3  Platelets 150 - 440 x10 3/mm 3 282    No images are attached to the encounter.  Nm  Sentinel Node Injection  Result Date: 07/25/2018 CLINICAL DATA:  Left breast cancer. EXAM: NUCLEAR MEDICINE BREAST LYMPHOSCINTIGRAPHY TECHNIQUE: Intradermal injection of radiopharmaceutical was performed at the 12 o'clock, 3 o'clock, 6 o'clock, and 9 o'clock positions around the left nipple. The patient was then sent to the operating room where the sentinel node(s) were identified and removed by the surgeon. RADIOPHARMACEUTICALS:  Total of 0.665 mCi Millipore-filtered Technetium-30msulfur colloid, injected in four aliquots. IMPRESSION: Uncomplicated intradermal injection of a total of 0.665 mCi Technetium-949mulfur colloid for purposes of sentinel node identification. Electronically Signed   By: AdMarkus Daft.D.   On: 07/25/2018 10:14   Mm Breast Surgical Specimen  Result Date: 07/25/2018 CLINICAL DATA:  [age]ear old female presenting for left breast lumpectomy. EXAM: SPECIMEN RADIOGRAPH OF THE LEFT BREAST COMPARISON:  Previous exam(s). FINDINGS: Status post excision of the left breast. The wire tip and biopsy marker clip are present and are marked for pathology. IMPRESSION: Specimen radiograph of the left breast. Electronically Signed   By: SeKristopher Oppenheim.D.   On: 07/25/2018 12:15   Mm Lt Plc Breast Loc Dev   1st Lesion  Inc Mammo Guide  Result Date: 07/25/2018 CLINICAL DATA:  [age]ear old female with biopsy proven invasive mammary carcinoma of the left breast. EXAM: NEEDLE LOCALIZATION OF THE LEFT BREAST WITH MAMMO GUIDANCE COMPARISON:  Previous exams. FINDINGS: Patient presents for needle localization prior to left breast lumpectomy. I met with the patient and we discussed the procedure of needle localization including benefits and alternatives. We discussed the high likelihood of a successful procedure. We discussed the risks of the procedure, including infection, bleeding, tissue injury, and further surgery. Informed, written consent was given. The usual time-out protocol was performed immediately  prior to the procedure. Using mammographic guidance, sterile technique, 1% lidocaine and a 7 cm  modified Kopans needle, a post biopsy clip at the site of the patient's distortion was localized using superior approach. The images were marked for Dr. Lysle Pearl. IMPRESSION: Needle localization left breast. No apparent complications. Electronically Signed   By: Kristopher Oppenheim M.D.   On: 07/25/2018 09:07     Assessment and plan- Patient is a 58 y.o. female with newly diagnosed invasive mammary carcinoma of the left breast pathological prognostic stage pT1 cpN1 acMX ER PR positive and HER-2/neu negative status post lumpectomy  I discussed the results of MammaPrint which came back as low risk.  Although she was clinically high risk based on MammaPrint testing she does not require any adjuvant chemotherapy at this time.  She was very happy to hear this.  I will make a referral to Dr. Donella Stade at this time to discuss adjuvant radiation treatment.  I did tell her that she would need radiation for her axilla as well.  Given that her tumor was ER PR positive hormone therapy is indicated at this time.  Idiscussed the role for hormone therapy. Given that she is postmenopausal I would favor 5 years of adjuvant hormone therapy with aromatase inhibitor. I discussed the risks and benefits of Arimidex including all but not limited to fatigue, hypercholesterolemia, hot flashes, arthralgias and worsening bone health.  Patient will also need to be on calcium 1200 mg along with vitamin D 800 international units.  We will obtain a baseline bone density scan written information about Arimidex given to the patient. I would like her to finish radiation therapy and start hormone therapy thereafter.  Treatment will be given with a curative intent patient verbalized understanding and agrees to proceed  She will call us and let us know when she is nearing the end of radiation treatment and we will prescribe her Arimidex at that time.  I  will see her back in 3 months with a CMP.  Bone density scan prior   Visit Diagnosis 1. Malignant neoplasm of upper-outer quadrant of left breast in female, estrogen receptor positive ([place])   2. Goals of care, counseling/discussion      Dr. Randa Evens, MD, MPH [facility] [phone] 08/13/2018 12:39 PM

## 2018-08-21 ENCOUNTER — Ambulatory Visit
Admission: RE | Admit: 2018-08-21 | Discharge: 2018-08-21 | Disposition: A | Discharge status: Home / Self Care | Payer: BC Managed Care – PPO | Source: Ambulatory Visit | Attending: Radiation Oncology | Admitting: Radiation Oncology

## 2018-08-21 ENCOUNTER — Encounter: Payer: Self-pay | Admitting: Radiation Oncology

## 2018-08-21 ENCOUNTER — Encounter (INDEPENDENT_AMBULATORY_CARE_PROVIDER_SITE_OTHER): Payer: Self-pay

## 2018-08-21 ENCOUNTER — Other Ambulatory Visit: Payer: Self-pay

## 2018-08-21 VITALS — BP 117/74 | HR 73 | Temp 98.1°F | Resp 16 | Wt 140.0 lb

## 2018-08-21 DIAGNOSIS — Z79899 Other long term (current) drug therapy: Secondary | ICD-10-CM | POA: Diagnosis not present

## 2018-08-21 DIAGNOSIS — C50412 Malignant neoplasm of upper-outer quadrant of left female breast: Secondary | ICD-10-CM

## 2018-08-21 DIAGNOSIS — E7800 Pure hypercholesterolemia, unspecified: Secondary | ICD-10-CM | POA: Diagnosis not present

## 2018-08-21 DIAGNOSIS — Z170 Estrogen receptor positive status [ER+]: Secondary | ICD-10-CM | POA: Diagnosis not present

## 2018-08-21 DIAGNOSIS — Z923 Personal history of irradiation: Secondary | ICD-10-CM | POA: Diagnosis not present

## 2018-08-21 NOTE — Consult Note (Signed)
NEW PATIENT EVALUATION  Name: Tabitha Tucker  MRN: [id number]  Date:   08/21/2018     DOB: 1960/12/19   This 58 y.o. female patient presents to the clinic for initial evaluation of stage I a (T1c N1 M M0 invasive mammary carcinoma the left breast ER/PR positive HER-2/neu negative.  REFERRING PHYSICIAN: Sindy Guadeloupe, MD  CHIEF COMPLAINT:  Chief Complaint  Patient presents with  . Breast Cancer    Initial consultation     DIAGNOSIS: The encounter diagnosis was Malignant neoplasm of upper-outer quadrant of left breast in female, estrogen receptor positive ([place]).   PREVIOUS INVESTIGATIONS:  Mammogram and ultrasound reviewed Clinical notes reviewed Pathology reports reviewed Case presented at breast cancer conference  HPI: patient is a 58 year old female who presented with an abnormal mammogram of her left breast. Mammogram showed a suspicious area of architectural distortion at the 1:00 position confirmed on ultrasound. She underwent ultrasound guided biopsy which was positive for invasive mammary carcinoma. She went on to have a wide local excision and sentinel node biopsy.tumor was 1.7 cm invasive mammary carcinoma overall grade 2. Margins were clear but close at less than 1 mm.one sentinel lymph node showed micrometastatic carcinoma at 1.5 mm. One sentinel lymph node also showed macro metastatic carcinoma at 5 mm.she did have reexcisionat the time of surgery of superior-lateral margins negative for atypical proliferative breast disease. Tumor was ER/PR positive HER-2/neu not overexpressed. Patient did have MammaPrint performed showing low risk for recurrence with no significant benefit from adjuvant chemotherapy. We discussed her case at tumor conference and felt that adding peripheral lymphatic field of radiation would be suitable rather thanputting the patient for additional surgery with axillary lymph node dissection. She is seen today for radiation oncology opinion. She is doing well. She  specifically denies breast tenderness cough or bone pain.  PLANNED TREATMENT REGIMEN: left breast and peripheral lymphatic radiation  PAST MEDICAL HISTORY:  has a past medical history of Breast cancer ([place]), High cholesterol, Panic attacks, and PONV (postoperative nausea and vomiting).    PAST SURGICAL HISTORY:  Past Surgical History:  Procedure Laterality Date  . ABDOMINAL HYSTERECTOMY     pt did keep ovaries  . APPENDECTOMY     ate age 42  . BREAST BIOPSY Left 07/10/2018   INVASIVE MAMMARY CARCINOMA  . BREAST LUMPECTOMY Left 07/25/2018   [place],   . BREAST LUMPECTOMY WITH NEEDLE LOCALIZATION AND AXILLARY SENTINEL LYMPH NODE BX Left 07/25/2018   Procedure: BREAST LUMPECTOMY WITH NEEDLE LOCALIZATION AND SENTINEL NODE BX;  Surgeon: Benjamine Sprague, DO;  Location: [facility] ORS;  Service: General;  Laterality: Left;    FAMILY HISTORY: family history includes Hypertension in her father.  SOCIAL HISTORY:  reports that she has never smoked. She has never used smokeless tobacco. She reports current alcohol use of about 14.0 standard drinks of alcohol per week. She reports previous drug use. Drug: Marijuana.  ALLERGIES: Patient has no known allergies.  MEDICATIONS:  Current Outpatient Medications  Medication Sig Dispense Refill  . acetaminophen (TYLENOL) 325 MG tablet Take 2 tablets (650 mg total) by mouth every 8 (eight) hours as needed for up to 30 days for mild pain. 40 tablet 0  . ALPRAZolam (XANAX) 0.25 MG tablet Take 0.25 mg by mouth 2 (two) times daily as needed for anxiety.    . B Complex-C (SUPER B COMPLEX PO) Take 1 tablet by mouth daily.    . Garlic 5993 MG CAPS Take 1,000 mg by mouth daily.    Marland Kitchen  ibuprofen (ADVIL,MOTRIN) 800 MG tablet Take 1 tablet (800 mg total) by mouth every 8 (eight) hours as needed for mild pain or moderate pain. 30 tablet 0  . simvastatin (ZOCOR) 10 MG tablet Take 10 mg by mouth daily.    . Biotin w/ Vitamins C & E (HAIR/SKIN/NAILS PO) Take 1 tablet by mouth daily.     . Coenzyme Q10 (CO Q-10) 100 MG CAPS Take 100 mg by mouth daily.    . SELENIUM PO Take 250 mcg by mouth daily.     No current facility-administered medications for this encounter.     ECOG PERFORMANCE STATUS:  0 - Asymptomatic  REVIEW OF SYSTEMS:  Patient denies any weight loss, fatigue, weakness, fever, chills or night sweats. Patient denies any loss of vision, blurred vision. Patient denies any ringing  of the ears or hearing loss. No irregular heartbeat. Patient denies heart murmur or history of fainting. Patient denies any chest pain or pain radiating to her upper extremities. Patient denies any shortness of breath, difficulty breathing at night, cough or hemoptysis. Patient denies any swelling in the lower legs. Patient denies any nausea vomiting, vomiting of blood, or coffee ground material in the vomitus. Patient denies any stomach pain. Patient states has had normal bowel movements no significant constipation or diarrhea. Patient denies any dysuria, hematuria or significant nocturia. Patient denies any problems walking, swelling in the joints or loss of balance. Patient denies any skin changes, loss of hair or loss of weight. Patient denies any excessive worrying or anxiety or significant depression. Patient denies any problems with insomnia. Patient denies excessive thirst, polyuria, polydipsia. Patient denies any swollen glands, patient denies easy bruising or easy bleeding. Patient denies any recent infections, allergies or URI. Patient "s visual fields have not changed significantly in recent time.    PHYSICAL EXAM: BP 117/74 (BP Location: Left Arm, Patient Position: Sitting)   Pulse 73   Temp 98.1 F (36.7 C) (Tympanic)   Resp 16   Wt 139 lb 15.9 oz (63.5 kg)   BMI 24.03 kg/m  Left breast is wide local excision scar well healed no dominant mass or nodularity is noted in either breast in 2 positions examined. No axillary or supraclavicular adenopathy is identified.Well-developed  well-nourished patient in NAD. HEENT reveals PERLA, EOMI, discs not visualized.  Oral cavity is clear. No oral mucosal lesions are identified. Neck is clear without evidence of cervical or supraclavicular adenopathy. Lungs are clear to A&P. Cardiac examination is essentially unremarkable with regular rate and rhythm without murmur rub or thrill. Abdomen is benign with no organomegaly or masses noted. Motor sensory and DTR levels are equal and symmetric in the upper and lower extremities. Cranial nerves II through XII are grossly intact. Proprioception is intact. No peripheral adenopathy or edema is identified. No motor or sensory levels are noted. Crude visual fields are within normal range.  LABORATORY DATA: pathology reports reviewed    RADIOLOGY RESULTS:mammogram and ultrasound reviewed   IMPRESSION: stage IA invasive mammary carcinoma the left breast in 58 year old female status post wide local excision and sentinel node biopsy for ER/PR positive HER-2/neu negative diseasewith 2 positive sentinel lymph nodes  PLAN: at this time I like to go ahead with whole breast radiation as well as peripheral lymphatic radiation. Would treat both sites to 5040 cGy in 28 fractions. I would also boost her scar another 1600 cGy based the initial close less than 1 mm margin. Risks and benefits of treatment including skin reaction fatigue alteration of  blood counts inclusion of superficial lung and slight chance of lymphedema in her left upper extremity all were discussed in detail with the patient. She seems to comprehend my treatment plan well. Patient will benefit from aromatase inhibitor treatment after completion of radiation. I have personally set up and ordered CT simulation for early next week. Patient and husband both comprehend my treatment plan well.  I would like to take this opportunity to thank you for allowing me to participate in the care of your patient.Noreene Filbert, MD

## 2018-08-26 ENCOUNTER — Ambulatory Visit
Admission: RE | Admit: 2018-08-26 | Discharge: 2018-08-26 | Disposition: A | Discharge status: Home / Self Care | Payer: BC Managed Care – PPO | Source: Ambulatory Visit | Attending: Radiation Oncology | Admitting: Radiation Oncology

## 2018-08-26 DIAGNOSIS — Z510 Encounter for antineoplastic radiation therapy: Secondary | ICD-10-CM | POA: Diagnosis not present

## 2018-08-26 DIAGNOSIS — C50412 Malignant neoplasm of upper-outer quadrant of left female breast: Secondary | ICD-10-CM | POA: Insufficient documentation

## 2018-08-27 DIAGNOSIS — Z510 Encounter for antineoplastic radiation therapy: Secondary | ICD-10-CM | POA: Diagnosis not present

## 2018-08-30 ENCOUNTER — Other Ambulatory Visit: Payer: Self-pay | Admitting: *Deleted

## 2018-08-30 DIAGNOSIS — Z170 Estrogen receptor positive status [ER+]: Principal | ICD-10-CM

## 2018-08-30 DIAGNOSIS — C50412 Malignant neoplasm of upper-outer quadrant of left female breast: Secondary | ICD-10-CM

## 2018-09-02 ENCOUNTER — Ambulatory Visit
Admission: RE | Admit: 2018-09-02 | Discharge: 2018-09-02 | Disposition: A | Discharge status: Home / Self Care | Payer: BC Managed Care – PPO | Source: Ambulatory Visit | Attending: Radiation Oncology | Admitting: Radiation Oncology

## 2018-09-02 DIAGNOSIS — C50412 Malignant neoplasm of upper-outer quadrant of left female breast: Secondary | ICD-10-CM | POA: Insufficient documentation

## 2018-09-02 DIAGNOSIS — Z510 Encounter for antineoplastic radiation therapy: Secondary | ICD-10-CM | POA: Diagnosis not present

## 2018-09-03 ENCOUNTER — Ambulatory Visit
Admission: RE | Admit: 2018-09-03 | Discharge: 2018-09-03 | Disposition: A | Discharge status: Home / Self Care | Payer: BC Managed Care – PPO | Source: Ambulatory Visit | Attending: Radiation Oncology | Admitting: Radiation Oncology

## 2018-09-03 DIAGNOSIS — Z510 Encounter for antineoplastic radiation therapy: Secondary | ICD-10-CM | POA: Diagnosis not present

## 2018-09-04 ENCOUNTER — Ambulatory Visit
Admission: RE | Admit: 2018-09-04 | Discharge: 2018-09-04 | Disposition: A | Discharge status: Home / Self Care | Payer: BC Managed Care – PPO | Source: Ambulatory Visit | Attending: Radiation Oncology | Admitting: Radiation Oncology

## 2018-09-04 DIAGNOSIS — Z510 Encounter for antineoplastic radiation therapy: Secondary | ICD-10-CM | POA: Diagnosis not present

## 2018-09-05 ENCOUNTER — Ambulatory Visit
Admission: RE | Admit: 2018-09-05 | Discharge: 2018-09-05 | Disposition: A | Discharge status: Home / Self Care | Payer: BC Managed Care – PPO | Source: Ambulatory Visit | Attending: Radiation Oncology | Admitting: Radiation Oncology

## 2018-09-05 DIAGNOSIS — Z510 Encounter for antineoplastic radiation therapy: Secondary | ICD-10-CM | POA: Diagnosis not present

## 2018-09-06 ENCOUNTER — Ambulatory Visit
Admission: RE | Admit: 2018-09-06 | Discharge: 2018-09-06 | Disposition: A | Discharge status: Home / Self Care | Payer: BC Managed Care – PPO | Source: Ambulatory Visit | Attending: Radiation Oncology | Admitting: Radiation Oncology

## 2018-09-06 DIAGNOSIS — Z510 Encounter for antineoplastic radiation therapy: Secondary | ICD-10-CM | POA: Diagnosis not present

## 2018-09-09 ENCOUNTER — Ambulatory Visit
Admission: RE | Admit: 2018-09-09 | Discharge: 2018-09-09 | Disposition: A | Discharge status: Home / Self Care | Payer: BC Managed Care – PPO | Source: Ambulatory Visit | Attending: Radiation Oncology | Admitting: Radiation Oncology

## 2018-09-09 DIAGNOSIS — Z510 Encounter for antineoplastic radiation therapy: Secondary | ICD-10-CM | POA: Diagnosis not present

## 2018-09-10 ENCOUNTER — Ambulatory Visit
Admission: RE | Admit: 2018-09-10 | Discharge: 2018-09-10 | Disposition: A | Discharge status: Home / Self Care | Payer: BC Managed Care – PPO | Source: Ambulatory Visit | Attending: Radiation Oncology | Admitting: Radiation Oncology

## 2018-09-10 DIAGNOSIS — Z510 Encounter for antineoplastic radiation therapy: Secondary | ICD-10-CM | POA: Diagnosis not present

## 2018-09-11 ENCOUNTER — Ambulatory Visit
Admission: RE | Admit: 2018-09-11 | Discharge: 2018-09-11 | Disposition: A | Discharge status: Home / Self Care | Payer: BC Managed Care – PPO | Source: Ambulatory Visit | Attending: Radiation Oncology | Admitting: Radiation Oncology

## 2018-09-11 DIAGNOSIS — Z510 Encounter for antineoplastic radiation therapy: Secondary | ICD-10-CM | POA: Diagnosis not present

## 2018-09-12 ENCOUNTER — Ambulatory Visit
Admission: RE | Admit: 2018-09-12 | Discharge: 2018-09-12 | Disposition: A | Discharge status: Home / Self Care | Payer: BC Managed Care – PPO | Source: Ambulatory Visit | Attending: Radiation Oncology | Admitting: Radiation Oncology

## 2018-09-12 ENCOUNTER — Other Ambulatory Visit: Payer: Self-pay

## 2018-09-12 DIAGNOSIS — Z510 Encounter for antineoplastic radiation therapy: Secondary | ICD-10-CM | POA: Diagnosis not present

## 2018-09-13 ENCOUNTER — Other Ambulatory Visit: Payer: Self-pay

## 2018-09-13 ENCOUNTER — Ambulatory Visit
Admission: RE | Admit: 2018-09-13 | Discharge: 2018-09-13 | Disposition: A | Discharge status: Home / Self Care | Payer: BC Managed Care – PPO | Source: Ambulatory Visit | Attending: Radiation Oncology | Admitting: Radiation Oncology

## 2018-09-13 DIAGNOSIS — Z510 Encounter for antineoplastic radiation therapy: Secondary | ICD-10-CM | POA: Diagnosis not present

## 2018-09-16 ENCOUNTER — Ambulatory Visit
Admission: RE | Admit: 2018-09-16 | Discharge: 2018-09-16 | Disposition: A | Discharge status: Home / Self Care | Payer: BC Managed Care – PPO | Source: Ambulatory Visit | Attending: Radiation Oncology | Admitting: Radiation Oncology

## 2018-09-16 ENCOUNTER — Other Ambulatory Visit: Payer: Self-pay

## 2018-09-16 DIAGNOSIS — Z510 Encounter for antineoplastic radiation therapy: Secondary | ICD-10-CM | POA: Diagnosis not present

## 2018-09-17 ENCOUNTER — Other Ambulatory Visit: Payer: BC Managed Care – PPO

## 2018-09-17 ENCOUNTER — Other Ambulatory Visit: Payer: Self-pay

## 2018-09-17 ENCOUNTER — Ambulatory Visit
Admission: RE | Admit: 2018-09-17 | Discharge: 2018-09-17 | Disposition: A | Discharge status: Home / Self Care | Payer: BC Managed Care – PPO | Source: Ambulatory Visit | Attending: Radiation Oncology | Admitting: Radiation Oncology

## 2018-09-17 DIAGNOSIS — Z510 Encounter for antineoplastic radiation therapy: Secondary | ICD-10-CM | POA: Diagnosis not present

## 2018-09-18 ENCOUNTER — Other Ambulatory Visit: Payer: Self-pay

## 2018-09-18 ENCOUNTER — Inpatient Hospital Stay: Payer: BC Managed Care – PPO

## 2018-09-18 ENCOUNTER — Ambulatory Visit
Admission: RE | Admit: 2018-09-18 | Discharge: 2018-09-18 | Disposition: A | Discharge status: Home / Self Care | Payer: BC Managed Care – PPO | Source: Ambulatory Visit | Attending: Radiation Oncology | Admitting: Radiation Oncology

## 2018-09-18 DIAGNOSIS — Z170 Estrogen receptor positive status [ER+]: Principal | ICD-10-CM

## 2018-09-18 DIAGNOSIS — C779 Secondary and unspecified malignant neoplasm of lymph node, unspecified: Secondary | ICD-10-CM | POA: Insufficient documentation

## 2018-09-18 DIAGNOSIS — C50412 Malignant neoplasm of upper-outer quadrant of left female breast: Secondary | ICD-10-CM | POA: Insufficient documentation

## 2018-09-18 DIAGNOSIS — Z510 Encounter for antineoplastic radiation therapy: Secondary | ICD-10-CM | POA: Diagnosis not present

## 2018-09-18 LAB — CBC
HCT: 40.3 % (ref 36.0–46.0)
Hemoglobin: 13.3 g/dL (ref 12.0–15.0)
MCH: 31.6 pg (ref 26.0–34.0)
MCHC: 33 g/dL (ref 30.0–36.0)
MCV: 95.7 fL (ref 80.0–100.0)
NRBC: 0 % (ref 0.0–0.2)
Platelets: 299 10*3/uL (ref 150–400)
RBC: 4.21 MIL/uL (ref 3.87–5.11)
RDW: 11.7 % (ref 11.5–15.5)
WBC: 5 10*3/uL (ref 4.0–10.5)

## 2018-09-19 ENCOUNTER — Other Ambulatory Visit: Payer: Self-pay

## 2018-09-19 ENCOUNTER — Ambulatory Visit
Admission: RE | Admit: 2018-09-19 | Discharge: 2018-09-19 | Disposition: A | Discharge status: Home / Self Care | Payer: BC Managed Care – PPO | Source: Ambulatory Visit | Attending: Radiation Oncology | Admitting: Radiation Oncology

## 2018-09-19 DIAGNOSIS — Z510 Encounter for antineoplastic radiation therapy: Secondary | ICD-10-CM | POA: Diagnosis not present

## 2018-09-20 ENCOUNTER — Other Ambulatory Visit: Payer: Self-pay

## 2018-09-20 ENCOUNTER — Ambulatory Visit
Admission: RE | Admit: 2018-09-20 | Discharge: 2018-09-20 | Disposition: A | Discharge status: Home / Self Care | Payer: BC Managed Care – PPO | Source: Ambulatory Visit | Attending: Radiation Oncology | Admitting: Radiation Oncology

## 2018-09-20 DIAGNOSIS — Z510 Encounter for antineoplastic radiation therapy: Secondary | ICD-10-CM | POA: Diagnosis not present

## 2018-09-23 ENCOUNTER — Ambulatory Visit
Admission: RE | Admit: 2018-09-23 | Discharge: 2018-09-23 | Disposition: A | Discharge status: Home / Self Care | Payer: BC Managed Care – PPO | Source: Ambulatory Visit | Attending: Radiation Oncology | Admitting: Radiation Oncology

## 2018-09-23 ENCOUNTER — Other Ambulatory Visit: Payer: BC Managed Care – PPO

## 2018-09-23 ENCOUNTER — Other Ambulatory Visit: Payer: Self-pay

## 2018-09-23 DIAGNOSIS — Z510 Encounter for antineoplastic radiation therapy: Secondary | ICD-10-CM | POA: Diagnosis not present

## 2018-09-24 ENCOUNTER — Ambulatory Visit
Admission: RE | Admit: 2018-09-24 | Discharge: 2018-09-24 | Disposition: A | Discharge status: Home / Self Care | Payer: BC Managed Care – PPO | Source: Ambulatory Visit | Attending: Radiation Oncology | Admitting: Radiation Oncology

## 2018-09-24 ENCOUNTER — Other Ambulatory Visit: Payer: Self-pay

## 2018-09-24 DIAGNOSIS — Z510 Encounter for antineoplastic radiation therapy: Secondary | ICD-10-CM | POA: Diagnosis not present

## 2018-09-25 ENCOUNTER — Ambulatory Visit
Admission: RE | Admit: 2018-09-25 | Discharge: 2018-09-25 | Disposition: A | Discharge status: Home / Self Care | Payer: BC Managed Care – PPO | Source: Ambulatory Visit | Attending: Radiation Oncology | Admitting: Radiation Oncology

## 2018-09-25 ENCOUNTER — Other Ambulatory Visit: Payer: Self-pay

## 2018-09-25 DIAGNOSIS — Z510 Encounter for antineoplastic radiation therapy: Secondary | ICD-10-CM | POA: Diagnosis not present

## 2018-09-26 ENCOUNTER — Other Ambulatory Visit: Payer: Self-pay

## 2018-09-26 ENCOUNTER — Ambulatory Visit
Admission: RE | Admit: 2018-09-26 | Discharge: 2018-09-26 | Disposition: A | Discharge status: Home / Self Care | Payer: BC Managed Care – PPO | Source: Ambulatory Visit | Attending: Radiation Oncology | Admitting: Radiation Oncology

## 2018-09-26 DIAGNOSIS — Z510 Encounter for antineoplastic radiation therapy: Secondary | ICD-10-CM | POA: Diagnosis not present

## 2018-09-27 ENCOUNTER — Other Ambulatory Visit: Payer: Self-pay

## 2018-09-27 ENCOUNTER — Ambulatory Visit
Admission: RE | Admit: 2018-09-27 | Discharge: 2018-09-27 | Disposition: A | Discharge status: Home / Self Care | Payer: BC Managed Care – PPO | Source: Ambulatory Visit | Attending: Radiation Oncology | Admitting: Radiation Oncology

## 2018-09-27 DIAGNOSIS — Z510 Encounter for antineoplastic radiation therapy: Secondary | ICD-10-CM | POA: Diagnosis not present

## 2018-09-30 ENCOUNTER — Other Ambulatory Visit: Payer: Self-pay

## 2018-09-30 ENCOUNTER — Ambulatory Visit
Admission: RE | Admit: 2018-09-30 | Discharge: 2018-09-30 | Disposition: A | Discharge status: Home / Self Care | Payer: BC Managed Care – PPO | Source: Ambulatory Visit | Attending: Radiation Oncology | Admitting: Radiation Oncology

## 2018-09-30 DIAGNOSIS — Z510 Encounter for antineoplastic radiation therapy: Secondary | ICD-10-CM | POA: Diagnosis not present

## 2018-10-01 ENCOUNTER — Other Ambulatory Visit: Payer: Self-pay

## 2018-10-01 ENCOUNTER — Ambulatory Visit
Admission: RE | Admit: 2018-10-01 | Discharge: 2018-10-01 | Disposition: A | Discharge status: Home / Self Care | Payer: BC Managed Care – PPO | Source: Ambulatory Visit | Attending: Radiation Oncology | Admitting: Radiation Oncology

## 2018-10-01 DIAGNOSIS — Z510 Encounter for antineoplastic radiation therapy: Secondary | ICD-10-CM | POA: Diagnosis not present

## 2018-10-02 ENCOUNTER — Ambulatory Visit
Admission: RE | Admit: 2018-10-02 | Discharge: 2018-10-02 | Disposition: A | Discharge status: Home / Self Care | Payer: BC Managed Care – PPO | Source: Ambulatory Visit | Attending: Radiation Oncology | Admitting: Radiation Oncology

## 2018-10-02 ENCOUNTER — Inpatient Hospital Stay: Payer: BC Managed Care – PPO | Attending: Oncology

## 2018-10-02 ENCOUNTER — Other Ambulatory Visit: Payer: Self-pay

## 2018-10-02 DIAGNOSIS — Z170 Estrogen receptor positive status [ER+]: Secondary | ICD-10-CM | POA: Insufficient documentation

## 2018-10-02 DIAGNOSIS — C50412 Malignant neoplasm of upper-outer quadrant of left female breast: Secondary | ICD-10-CM

## 2018-10-02 DIAGNOSIS — Z510 Encounter for antineoplastic radiation therapy: Secondary | ICD-10-CM | POA: Insufficient documentation

## 2018-10-02 LAB — CBC
HCT: 40.9 % (ref 36.0–46.0)
Hemoglobin: 13.7 g/dL (ref 12.0–15.0)
MCH: 32.1 pg (ref 26.0–34.0)
MCHC: 33.5 g/dL (ref 30.0–36.0)
MCV: 95.8 fL (ref 80.0–100.0)
Platelets: 245 10*3/uL (ref 150–400)
RBC: 4.27 MIL/uL (ref 3.87–5.11)
RDW: 11.8 % (ref 11.5–15.5)
WBC: 5.3 10*3/uL (ref 4.0–10.5)
nRBC: 0 % (ref 0.0–0.2)

## 2018-10-03 ENCOUNTER — Other Ambulatory Visit: Payer: Self-pay

## 2018-10-03 ENCOUNTER — Ambulatory Visit
Admission: RE | Admit: 2018-10-03 | Discharge: 2018-10-03 | Disposition: A | Discharge status: Home / Self Care | Payer: BC Managed Care – PPO | Source: Ambulatory Visit | Attending: Radiation Oncology | Admitting: Radiation Oncology

## 2018-10-03 DIAGNOSIS — Z510 Encounter for antineoplastic radiation therapy: Secondary | ICD-10-CM | POA: Diagnosis not present

## 2018-10-04 ENCOUNTER — Other Ambulatory Visit: Payer: Self-pay

## 2018-10-04 ENCOUNTER — Ambulatory Visit
Admission: RE | Admit: 2018-10-04 | Discharge: 2018-10-04 | Disposition: A | Discharge status: Home / Self Care | Payer: BC Managed Care – PPO | Source: Ambulatory Visit | Attending: Radiation Oncology | Admitting: Radiation Oncology

## 2018-10-04 ENCOUNTER — Encounter: Payer: Self-pay | Admitting: *Deleted

## 2018-10-04 DIAGNOSIS — Z510 Encounter for antineoplastic radiation therapy: Secondary | ICD-10-CM | POA: Diagnosis not present

## 2018-10-07 ENCOUNTER — Ambulatory Visit
Admission: RE | Admit: 2018-10-07 | Discharge: 2018-10-07 | Disposition: A | Discharge status: Home / Self Care | Payer: BC Managed Care – PPO | Source: Ambulatory Visit | Attending: Radiation Oncology | Admitting: Radiation Oncology

## 2018-10-07 ENCOUNTER — Other Ambulatory Visit: Payer: Self-pay

## 2018-10-07 DIAGNOSIS — Z510 Encounter for antineoplastic radiation therapy: Secondary | ICD-10-CM | POA: Diagnosis not present

## 2018-10-08 ENCOUNTER — Other Ambulatory Visit: Payer: Self-pay

## 2018-10-08 ENCOUNTER — Ambulatory Visit
Admission: RE | Admit: 2018-10-08 | Discharge: 2018-10-08 | Disposition: A | Discharge status: Home / Self Care | Payer: BC Managed Care – PPO | Source: Ambulatory Visit | Attending: Radiation Oncology | Admitting: Radiation Oncology

## 2018-10-08 DIAGNOSIS — Z510 Encounter for antineoplastic radiation therapy: Secondary | ICD-10-CM | POA: Diagnosis not present

## 2018-10-09 ENCOUNTER — Other Ambulatory Visit: Payer: Self-pay

## 2018-10-09 ENCOUNTER — Ambulatory Visit
Admission: RE | Admit: 2018-10-09 | Discharge: 2018-10-09 | Disposition: A | Discharge status: Home / Self Care | Payer: BC Managed Care – PPO | Source: Ambulatory Visit | Attending: Radiation Oncology | Admitting: Radiation Oncology

## 2018-10-09 DIAGNOSIS — Z510 Encounter for antineoplastic radiation therapy: Secondary | ICD-10-CM | POA: Diagnosis not present

## 2018-10-10 ENCOUNTER — Ambulatory Visit
Admission: RE | Admit: 2018-10-10 | Discharge: 2018-10-10 | Disposition: A | Discharge status: Home / Self Care | Payer: BC Managed Care – PPO | Source: Ambulatory Visit | Attending: Radiation Oncology | Admitting: Radiation Oncology

## 2018-10-10 ENCOUNTER — Other Ambulatory Visit: Payer: Self-pay

## 2018-10-10 DIAGNOSIS — Z510 Encounter for antineoplastic radiation therapy: Secondary | ICD-10-CM | POA: Diagnosis not present

## 2018-10-11 ENCOUNTER — Ambulatory Visit
Admission: RE | Admit: 2018-10-11 | Discharge: 2018-10-11 | Disposition: A | Discharge status: Home / Self Care | Payer: BC Managed Care – PPO | Source: Ambulatory Visit | Attending: Radiation Oncology | Admitting: Radiation Oncology

## 2018-10-11 ENCOUNTER — Other Ambulatory Visit: Payer: Self-pay

## 2018-10-11 DIAGNOSIS — Z510 Encounter for antineoplastic radiation therapy: Secondary | ICD-10-CM | POA: Diagnosis not present

## 2018-10-14 ENCOUNTER — Other Ambulatory Visit: Payer: Self-pay

## 2018-10-14 ENCOUNTER — Ambulatory Visit
Admission: RE | Admit: 2018-10-14 | Discharge: 2018-10-14 | Disposition: A | Discharge status: Home / Self Care | Payer: BC Managed Care – PPO | Source: Ambulatory Visit | Attending: Radiation Oncology | Admitting: Radiation Oncology

## 2018-10-14 DIAGNOSIS — Z510 Encounter for antineoplastic radiation therapy: Secondary | ICD-10-CM | POA: Diagnosis not present

## 2018-10-15 ENCOUNTER — Ambulatory Visit
Admission: RE | Admit: 2018-10-15 | Discharge: 2018-10-15 | Disposition: A | Discharge status: Home / Self Care | Payer: BC Managed Care – PPO | Source: Ambulatory Visit | Attending: Radiation Oncology | Admitting: Radiation Oncology

## 2018-10-15 ENCOUNTER — Other Ambulatory Visit: Payer: Self-pay

## 2018-10-15 DIAGNOSIS — Z510 Encounter for antineoplastic radiation therapy: Secondary | ICD-10-CM | POA: Diagnosis not present

## 2018-10-16 ENCOUNTER — Ambulatory Visit
Admission: RE | Admit: 2018-10-16 | Discharge: 2018-10-16 | Disposition: A | Discharge status: Home / Self Care | Payer: BC Managed Care – PPO | Source: Ambulatory Visit | Attending: Radiation Oncology | Admitting: Radiation Oncology

## 2018-10-16 ENCOUNTER — Other Ambulatory Visit: Payer: Self-pay

## 2018-10-16 ENCOUNTER — Inpatient Hospital Stay: Payer: BC Managed Care – PPO | Attending: Oncology

## 2018-10-16 DIAGNOSIS — Z510 Encounter for antineoplastic radiation therapy: Secondary | ICD-10-CM | POA: Diagnosis not present

## 2018-10-16 DIAGNOSIS — C50412 Malignant neoplasm of upper-outer quadrant of left female breast: Secondary | ICD-10-CM

## 2018-10-16 DIAGNOSIS — Z170 Estrogen receptor positive status [ER+]: Principal | ICD-10-CM

## 2018-10-16 LAB — CBC
HCT: 38.9 % (ref 36.0–46.0)
Hemoglobin: 13 g/dL (ref 12.0–15.0)
MCH: 32.1 pg (ref 26.0–34.0)
MCHC: 33.4 g/dL (ref 30.0–36.0)
MCV: 96 fL (ref 80.0–100.0)
Platelets: 240 10*3/uL (ref 150–400)
RBC: 4.05 MIL/uL (ref 3.87–5.11)
RDW: 11.9 % (ref 11.5–15.5)
WBC: 4.3 10*3/uL (ref 4.0–10.5)
nRBC: 0 % (ref 0.0–0.2)

## 2018-10-17 ENCOUNTER — Other Ambulatory Visit: Payer: Self-pay

## 2018-10-17 ENCOUNTER — Ambulatory Visit
Admission: RE | Admit: 2018-10-17 | Discharge: 2018-10-17 | Disposition: A | Discharge status: Home / Self Care | Payer: BC Managed Care – PPO | Source: Ambulatory Visit | Attending: Radiation Oncology | Admitting: Radiation Oncology

## 2018-10-17 DIAGNOSIS — Z510 Encounter for antineoplastic radiation therapy: Secondary | ICD-10-CM | POA: Diagnosis not present

## 2018-10-18 ENCOUNTER — Ambulatory Visit
Admission: RE | Admit: 2018-10-18 | Discharge: 2018-10-18 | Disposition: A | Discharge status: Home / Self Care | Payer: BC Managed Care – PPO | Source: Ambulatory Visit | Attending: Radiation Oncology | Admitting: Radiation Oncology

## 2018-10-18 ENCOUNTER — Other Ambulatory Visit: Payer: Self-pay

## 2018-10-18 DIAGNOSIS — Z510 Encounter for antineoplastic radiation therapy: Secondary | ICD-10-CM | POA: Diagnosis not present

## 2018-10-21 ENCOUNTER — Other Ambulatory Visit: Payer: Self-pay

## 2018-10-21 ENCOUNTER — Ambulatory Visit
Admission: RE | Admit: 2018-10-21 | Discharge: 2018-10-21 | Disposition: A | Discharge status: Home / Self Care | Payer: BC Managed Care – PPO | Source: Ambulatory Visit | Attending: Radiation Oncology | Admitting: Radiation Oncology

## 2018-10-21 DIAGNOSIS — Z510 Encounter for antineoplastic radiation therapy: Secondary | ICD-10-CM | POA: Diagnosis not present

## 2018-10-22 ENCOUNTER — Other Ambulatory Visit: Payer: Self-pay | Admitting: *Deleted

## 2018-10-22 ENCOUNTER — Other Ambulatory Visit: Payer: Self-pay

## 2018-10-22 ENCOUNTER — Telehealth: Payer: Self-pay | Admitting: *Deleted

## 2018-10-22 ENCOUNTER — Ambulatory Visit
Admission: RE | Admit: 2018-10-22 | Discharge: 2018-10-22 | Disposition: A | Discharge status: Home / Self Care | Payer: BC Managed Care – PPO | Source: Ambulatory Visit | Attending: Radiation Oncology | Admitting: Radiation Oncology

## 2018-10-22 DIAGNOSIS — Z510 Encounter for antineoplastic radiation therapy: Secondary | ICD-10-CM | POA: Diagnosis not present

## 2018-10-22 MED ORDER — ANASTROZOLE 1 MG PO TABS: 1.0000 mg | ORAL_TABLET | Freq: Every day | ORAL | 2 refills | Status: DC

## 2018-10-22 NOTE — Telephone Encounter (Signed)
The patient had sent a message through staff downstairs that she will finish with radiation today. I sent in rx to her pharmacy today for arimidex. I explained to take it once a day on regular basis and she can decide if she wants to take in am or pm her choice. Just make sure she makes a habit of taking it daily. Also she is to take calcium and vit. D . I went over the amount of each. She found a 1000 IU of vit. D and I told her if that isthe one she wants to take she would only need 1  A day and make sure calccium is 1200 mg a day. She will do that. She also has swelling from radiation and she will monitor it and I told her if it does not get better over next 2 weeks or gets worse to call us. And she has an appt 5/11 and she will keep that appt as a check up on how the new med is doing when she starts it tom. She is agreeable to the plan

## 2018-10-30 ENCOUNTER — Other Ambulatory Visit: Payer: BC Managed Care – PPO

## 2018-11-10 ENCOUNTER — Other Ambulatory Visit: Payer: Self-pay

## 2018-11-11 ENCOUNTER — Inpatient Hospital Stay: Payer: BC Managed Care – PPO | Attending: Oncology

## 2018-11-11 ENCOUNTER — Inpatient Hospital Stay: Payer: BC Managed Care – PPO | Admitting: Oncology

## 2018-11-11 ENCOUNTER — Ambulatory Visit: Payer: BC Managed Care – PPO | Admitting: Oncology

## 2018-11-11 ENCOUNTER — Other Ambulatory Visit: Payer: Self-pay

## 2018-11-11 DIAGNOSIS — Z7289 Other problems related to lifestyle: Secondary | ICD-10-CM | POA: Insufficient documentation

## 2018-11-11 DIAGNOSIS — C50412 Malignant neoplasm of upper-outer quadrant of left female breast: Secondary | ICD-10-CM | POA: Insufficient documentation

## 2018-11-11 DIAGNOSIS — Z170 Estrogen receptor positive status [ER+]: Secondary | ICD-10-CM | POA: Diagnosis not present

## 2018-11-11 LAB — COMPREHENSIVE METABOLIC PANEL
ALT: 16 U/L (ref 0–44)
AST: 24 U/L (ref 15–41)
Albumin: 4.2 g/dL (ref 3.5–5.0)
Alkaline Phosphatase: 68 U/L (ref 38–126)
Anion gap: 10 (ref 5–15)
BUN: 15 mg/dL (ref 6–20)
CO2: 26 mmol/L (ref 22–32)
Calcium: 9.3 mg/dL (ref 8.9–10.3)
Chloride: 105 mmol/L (ref 98–111)
Creatinine, Ser: 0.93 mg/dL (ref 0.44–1.00)
GFR calc Af Amer: >60 mL/min (ref >60)
GFR calc non Af Amer: >60 mL/min (ref >60)
Glucose, Bld: 94 mg/dL (ref 70–99)
Potassium: 4.3 mmol/L (ref 3.5–5.1)
Sodium: 141 mmol/L (ref 135–145)
Total Bilirubin: 0.6 mg/dL (ref 0.3–1.2)
Total Protein: 7.6 g/dL (ref 6.5–8.1)

## 2018-11-11 LAB — CBC WITH DIFFERENTIAL/PLATELET
Abs Immature Granulocytes: 0.01 10*3/uL (ref 0.00–0.07)
Basophils Absolute: 0 10*3/uL (ref 0.0–0.1)
Basophils Relative: 0 %
Eosinophils Absolute: 0.1 10*3/uL (ref 0.0–0.5)
Eosinophils Relative: 2 %
HCT: 39.7 % (ref 36.0–46.0)
Hemoglobin: 13.7 g/dL (ref 12.0–15.0)
Immature Granulocytes: 0 %
Lymphocytes Relative: 19 %
Lymphs Abs: 0.8 10*3/uL (ref 0.7–4.0)
MCH: 32.6 pg (ref 26.0–34.0)
MCHC: 34.5 g/dL (ref 30.0–36.0)
MCV: 94.5 fL (ref 80.0–100.0)
Monocytes Absolute: 0.4 10*3/uL (ref 0.1–1.0)
Monocytes Relative: 9 %
Neutro Abs: 3.1 10*3/uL (ref 1.7–7.7)
Neutrophils Relative %: 70 %
Platelets: 223 10*3/uL (ref 150–400)
RBC: 4.2 MIL/uL (ref 3.87–5.11)
RDW: 12.1 % (ref 11.5–15.5)
WBC: 4.5 10*3/uL (ref 4.0–10.5)
nRBC: 0 % (ref 0.0–0.2)

## 2018-11-13 ENCOUNTER — Other Ambulatory Visit: Payer: Self-pay

## 2018-11-14 ENCOUNTER — Inpatient Hospital Stay (HOSPITAL_BASED_OUTPATIENT_CLINIC_OR_DEPARTMENT_OTHER): Payer: BC Managed Care – PPO | Admitting: Oncology

## 2018-11-14 ENCOUNTER — Encounter: Payer: Self-pay | Admitting: Oncology

## 2018-11-14 DIAGNOSIS — Z79811 Long term (current) use of aromatase inhibitors: Secondary | ICD-10-CM | POA: Diagnosis not present

## 2018-11-14 DIAGNOSIS — Z170 Estrogen receptor positive status [ER+]: Secondary | ICD-10-CM

## 2018-11-14 DIAGNOSIS — C50412 Malignant neoplasm of upper-outer quadrant of left female breast: Secondary | ICD-10-CM

## 2018-11-14 DIAGNOSIS — Z79899 Other long term (current) drug therapy: Secondary | ICD-10-CM

## 2018-11-14 DIAGNOSIS — Z90710 Acquired absence of both cervix and uterus: Secondary | ICD-10-CM

## 2018-11-14 DIAGNOSIS — Z5181 Encounter for therapeutic drug level monitoring: Secondary | ICD-10-CM

## 2018-11-14 NOTE — Progress Notes (Signed)
Doing well on her arimidex. No pain

## 2018-11-15 NOTE — Progress Notes (Signed)
I connected with Tabitha Tucker on 11/15/18 at 10:00 AM EDT by video enabled telemedicine visit and verified that I am speaking with the correct person using two identifiers.   I discussed the limitations, risks, security and privacy concerns of performing an evaluation and management service by telemedicine and the availability of in-person appointments. I also discussed with the patient that there may be a patient responsible charge related to this service. The patient expressed understanding and agreed to proceed.  Other persons participating in the visit and their role in the encounter:  none  Patient's location:  home Provider's location:  [facility]  Diagnosis- pathological prognostic stage IApT1c pN1MX invasive mammary carcinoma of the left breast ER PR positive HER-2/neu negative   Chief Complaint: Routine follow-up of breast cancer to assess tolerance to Arimidex  History of present illness: patient is a 58 year old Caucasian female who recently underwent a screening mammogram which showed 1.7 x 1.2 x 1.6 cm hypoechoic irregular area in her left breast at 1 o'clock position. No evidence of left axillary adenopathy. This was 5 biopsied and was invasive mammary carcinoma grade 2, ER greater than 90% positive PR 11 to 50% positive and HER-2/neu negative. Patient will be seeing surgery tomorrow. She has never had prior breast biopsies. She is G2 P2 L2. She has had a hysterectomy many years ago. No significant family history of breast cancer, ovarian cancer, colon gastric or pancreatic cancer.  Patient underwent lumpectomy with sentinel lymph node biopsy on 07/25/2018. Final pathology showed invasive mammary carcinoma 1.7 cm, grade 2 with negative margins. 2 out of 2 sentinel lymph nodes were positive for malignancy. One lymph node was involved by micrometastatic carcinoma 1.5 mm and the other lymph node was involved with macro metastatic disease 5 mm. There was focal  microscopy extracapsular extension present which I verified with Dr. Reuel Derby from pathology. pT1c pN1A.  Systemic CT scans denied by insurance.  She completed adjuvant radiation treatment  Patient underwent MammaPrint testing which came back as low risk with no significant benefit from adjuvant chemotherapy.  Interval history: Patient completed adjuvant radiation treatment in April 2020 and tolerated it well without any significant side effects.  She has started taking Arimidex over the last 3 weeks and reports tolerating it well.  Denies any hot flashes or joint pains.  Reports occasional fatigue.   Review of Systems  Constitutional: Positive for malaise/fatigue. Negative for chills, fever and weight loss.  HENT: Negative for congestion, ear discharge and nosebleeds.   Eyes: Negative for blurred vision.  Respiratory: Negative for cough, hemoptysis, sputum production, shortness of breath and wheezing.   Cardiovascular: Negative for chest pain, palpitations, orthopnea and claudication.  Gastrointestinal: Negative for abdominal pain, blood in stool, constipation, diarrhea, heartburn, melena, nausea and vomiting.  Genitourinary: Negative for dysuria, flank pain, frequency, hematuria and urgency.  Musculoskeletal: Negative for back pain, joint pain and myalgias.  Skin: Negative for rash.  Neurological: Negative for dizziness, tingling, focal weakness, seizures, weakness and headaches.  Endo/Heme/Allergies: Does not bruise/bleed easily.  Psychiatric/Behavioral: Negative for depression and suicidal ideas. The patient does not have insomnia.     No Known Allergies  Past Medical History:  Diagnosis Date  . Breast cancer ([place])   . High cholesterol   . Panic attacks   . PONV (postoperative nausea and vomiting)     Past Surgical History:  Procedure Laterality Date  . ABDOMINAL HYSTERECTOMY     pt did keep ovaries  . APPENDECTOMY     ate  age 62  . BREAST BIOPSY Left 07/10/2018   INVASIVE  MAMMARY CARCINOMA  . BREAST LUMPECTOMY Left 07/25/2018   [place],   . BREAST LUMPECTOMY WITH NEEDLE LOCALIZATION AND AXILLARY SENTINEL LYMPH NODE BX Left 07/25/2018   Procedure: BREAST LUMPECTOMY WITH NEEDLE LOCALIZATION AND SENTINEL NODE BX;  Surgeon: Benjamine Sprague, DO;  Location: [facility] ORS;  Service: General;  Laterality: Left;    Social History   Socioeconomic History  . Marital status: Married    Spouse name: Not on file  . Number of children: Not on file  . Years of education: Not on file  . Highest education level: Not on file  Occupational History  . Not on file  Social Needs  . Financial resource strain: Not on file  . Food insecurity:    Worry: Not on file    Inability: Not on file  . Transportation needs:    Medical: Not on file    Non-medical: Not on file  Tobacco Use  . Smoking status: Never Smoker  . Smokeless tobacco: Never Used  Substance and Sexual Activity  . Alcohol use: Yes    Alcohol/week: 14.0 standard drinks    Types: 14 Glasses of wine per week    Comment: 2 glasses of wine every day  . Drug use: Not Currently    Types: Marijuana    Comment: marijuana when she was a teenager a few time  . Sexual activity: Yes  Lifestyle  . Physical activity:    Days per week: Not on file    Minutes per session: Not on file  . Stress: Not on file  Relationships  . Social connections:    Talks on phone: Not on file    Gets together: Not on file    Attends religious service: Not on file    Active member of club or organization: Not on file    Attends meetings of clubs or organizations: Not on file    Relationship status: Not on file  . Intimate partner violence:    Fear of current or ex partner: Not on file    Emotionally abused: Not on file    Physically abused: Not on file    Forced sexual activity: Not on file  Other Topics Concern  . Not on file  Social History Narrative  . Not on file    Family History  Problem Relation Age of Onset  . Hypertension  Father   . Breast cancer Neg Hx      Current Outpatient Medications:  .  ALPRAZolam (XANAX) 0.25 MG tablet, Take 0.25 mg by mouth 2 (two) times daily as needed for anxiety., Disp: , Rfl:  .  anastrozole (ARIMIDEX) 1 MG tablet, Take 1 tablet (1 mg total) by mouth daily., Disp: 30 tablet, Rfl: 2 .  B Complex-C (SUPER B COMPLEX PO), Take 1 tablet by mouth daily., Disp: , Rfl:  .  Calcium Carbonate-Vitamin D3 (CALCIUM 600-D) 600-400 MG-UNIT TABS, Take 1 tablet by mouth 2 (two) times a day., Disp: , Rfl:  .  Garlic 6734 MG CAPS, Take 1,000 mg by mouth daily., Disp: , Rfl:  .  ibuprofen (ADVIL,MOTRIN) 800 MG tablet, Take 1 tablet (800 mg total) by mouth every 8 (eight) hours as needed for mild pain or moderate pain., Disp: 30 tablet, Rfl: 0 .  simvastatin (ZOCOR) 10 MG tablet, Take 10 mg by mouth daily., Disp: , Rfl:   No results found.  No images are attached to the encounter.  CMP Latest Ref Rng & Units 11/11/2018  Glucose 70 - 99 mg/dL 94  BUN 6 - 20 mg/dL 15  Creatinine 0.44 - 1.00 mg/dL 0.93  Sodium 135 - 145 mmol/L 141  Potassium 3.5 - 5.1 mmol/L 4.3  Chloride 98 - 111 mmol/L 105  CO2 22 - 32 mmol/L 26  Calcium 8.9 - 10.3 mg/dL 9.3  Total Protein 6.5 - 8.1 g/dL 7.6  Total Bilirubin 0.3 - 1.2 mg/dL 0.6  Alkaline Phos 38 - 126 U/L 68  AST 15 - 41 U/L 24  ALT 0 - 44 U/L 16   CBC Latest Ref Rng & Units 11/11/2018  WBC 4.0 - 10.5 K/uL 4.5  Hemoglobin 12.0 - 15.0 g/dL 13.7  Hematocrit 36.0 - 46.0 % 39.7  Platelets 150 - 400 K/uL 223     Observation/objective: Appears in no acute distress over video visit today.  Breathing is nonlabored  Assessment and plan: Patient is a 58 year old female with pathological prognostic stage Ia invasive mammary carcinoma of the left breast pT1 pN1 cM0 ER PR positive HER-2/neu negative status post lumpectomy and adjuvant radiation treatment currently on Arimidex  Patient is scheduled for her baseline bone density scan next month.  Overall she is  tolerating Arimidex well without any significant side effects along with calcium and vitamin D.  She will continue to take Arimidex for at least 5 years if not 10 years.  Labs from yesterday: CBC with differential and CMP unremarkable  Follow-up instructions: I will see her back in 3 months time.  No labs  I discussed the assessment and treatment plan with the patient. The patient was provided an opportunity to ask questions and all were answered. The patient agreed with the plan and demonstrated an understanding of the instructions.   The patient was advised to call back or seek an in-person evaluation if the symptoms worsen or if the condition fails to improve as anticipated.   Visit Diagnosis: 1. Visit for monitoring Arimidex therapy   2. Malignant neoplasm of upper-outer quadrant of left breast in female, estrogen receptor positive ([place])     Dr. Randa Evens, MD, MPH [facility] [phone] 11/15/2018 8:26 AM

## 2018-12-03 ENCOUNTER — Other Ambulatory Visit: Payer: Self-pay

## 2018-12-04 ENCOUNTER — Encounter: Payer: Self-pay | Admitting: Radiation Oncology

## 2018-12-04 ENCOUNTER — Other Ambulatory Visit: Payer: Self-pay

## 2018-12-04 ENCOUNTER — Ambulatory Visit
Admission: RE | Admit: 2018-12-04 | Discharge: 2018-12-04 | Disposition: A | Discharge status: Home / Self Care | Payer: BC Managed Care – PPO | Source: Ambulatory Visit | Attending: Radiation Oncology | Admitting: Radiation Oncology

## 2018-12-04 VITALS — BP 131/74 | HR 84 | Temp 98.2°F | Resp 18 | Wt 139.0 lb

## 2018-12-04 DIAGNOSIS — Z170 Estrogen receptor positive status [ER+]: Secondary | ICD-10-CM | POA: Insufficient documentation

## 2018-12-04 DIAGNOSIS — C50412 Malignant neoplasm of upper-outer quadrant of left female breast: Secondary | ICD-10-CM

## 2018-12-04 DIAGNOSIS — Z79811 Long term (current) use of aromatase inhibitors: Secondary | ICD-10-CM | POA: Insufficient documentation

## 2018-12-04 DIAGNOSIS — Z923 Personal history of irradiation: Secondary | ICD-10-CM | POA: Insufficient documentation

## 2018-12-04 NOTE — Progress Notes (Signed)
Radiation Oncology Follow up Note  Name: Tabitha Tucker   Date:   12/04/2018 MRN:  [id number] DOB: 01-17-1961    This 58 y.o. female presents to the clinic today for 1 month follow-up status post whole breast radiation to her left breast for stage Ia ER PR positive invasive mammary carcinoma.  REFERRING PROVIDER: Ward, Honor Loh, MD  HPI: Patient is a 58 year old female now seen at 1 month having completed whole breast radiation to her left breast for ER PR positive HER-2/neu negative stage Ia invasive mammary carcinoma seen today in routine follow-up she is doing well she states she feels like her left breast is somewhat heavier than the right I explained to her this is due to scarring in the breast and will resolve over time.  She specifically denies breast tenderness cough or bone pain.  She has been started on.  Arimadex is tolerating that well without side effect.  COMPLICATIONS OF TREATMENT: none  FOLLOW UP COMPLIANCE: keeps appointments   PHYSICAL EXAM:  BP 131/74 (BP Location: Left Arm, Patient Position: Sitting)   Pulse 84   Temp 98.2 F (36.8 C) (Tympanic)   Resp 18   Wt 139 lb (63 kg)   BMI 23.86 kg/m  Lungs are clear to A&P cardiac examination essentially unremarkable with regular rate and rhythm. No dominant mass or nodularity is noted in either breast in 2 positions examined. Incision is well-healed. No axillary or supraclavicular adenopathy is appreciated. Cosmetic result is excellent.  Well-developed well-nourished patient in NAD. HEENT reveals PERLA, EOMI, discs not visualized.  Oral cavity is clear. No oral mucosal lesions are identified. Neck is clear without evidence of cervical or supraclavicular adenopathy. Lungs are clear to A&P. Cardiac examination is essentially unremarkable with regular rate and rhythm without murmur rub or thrill. Abdomen is benign with no organomegaly or masses noted. Motor sensory and DTR levels are equal and symmetric in the upper and lower  extremities. Cranial nerves II through XII are grossly intact. Proprioception is intact. No peripheral adenopathy or edema is identified. No motor or sensory levels are noted. Crude visual fields are within normal range.  RADIOLOGY RESULTS: No current films for review  PLAN: Present time she is doing well very little side effect profile secondary to her whole breast radiation.  Of assured her the best thing to do is exercise that the heaviness feeling will dissipate over time.  I have asked to see her back in 4 to 5 months for follow-up.  She continues on arimadex without side effect.  Patient knows to call with any concerns.  I would like to take this opportunity to thank you for allowing me to participate in the care of your patient.Noreene Filbert, MD

## 2018-12-16 ENCOUNTER — Other Ambulatory Visit: Payer: Self-pay | Admitting: Oncology

## 2018-12-17 ENCOUNTER — Encounter: Payer: Self-pay | Admitting: Oncology

## 2018-12-19 ENCOUNTER — Other Ambulatory Visit: Payer: Self-pay

## 2018-12-19 ENCOUNTER — Ambulatory Visit
Admission: RE | Admit: 2018-12-19 | Discharge: 2018-12-19 | Disposition: A | Discharge status: Home / Self Care | Payer: BC Managed Care – PPO | Source: Ambulatory Visit | Attending: Oncology | Admitting: Oncology

## 2018-12-19 DIAGNOSIS — Z170 Estrogen receptor positive status [ER+]: Secondary | ICD-10-CM | POA: Diagnosis present

## 2018-12-19 DIAGNOSIS — C50412 Malignant neoplasm of upper-outer quadrant of left female breast: Secondary | ICD-10-CM | POA: Diagnosis not present

## 2018-12-19 HISTORY — DX: Personal history of irradiation: Z92.3

## 2019-01-10 ENCOUNTER — Other Ambulatory Visit: Payer: Self-pay | Admitting: *Deleted

## 2019-01-10 NOTE — Telephone Encounter (Signed)
Patient  has refill on current rx

## 2019-01-13 ENCOUNTER — Other Ambulatory Visit: Payer: Self-pay | Admitting: *Deleted

## 2019-01-13 ENCOUNTER — Telehealth: Payer: Self-pay | Admitting: *Deleted

## 2019-01-13 MED ORDER — MAGIC MOUTHWASH W/LIDOCAINE: 5.0000 mL | Freq: Four times a day (QID) | ORAL | 0 refills | Status: DC | PRN

## 2019-01-13 NOTE — Telephone Encounter (Signed)
Patient called reporting that her mouth has several sores/ ulcers and is wandering if she is having a reaction to her Anastrozole. Please advise

## 2019-01-13 NOTE — Telephone Encounter (Signed)
Patient has been on ansatrozole for a while now and it is unlikely that her mouth sores are because of that. Can you please prescribe her magic mouthwash and see if it helps her. If it doesn't, she can be seen at [facility]

## 2019-01-13 NOTE — Telephone Encounter (Signed)
I sent electronic rx since I could not get them on the phone

## 2019-02-27 ENCOUNTER — Other Ambulatory Visit: Payer: Self-pay

## 2019-02-27 ENCOUNTER — Inpatient Hospital Stay: Payer: BC Managed Care – PPO | Attending: Oncology | Admitting: Oncology

## 2019-02-27 VITALS — BP 147/93 | HR 77 | Temp 97.3°F | Wt 139.6 lb

## 2019-02-27 DIAGNOSIS — Z791 Long term (current) use of non-steroidal anti-inflammatories (NSAID): Secondary | ICD-10-CM | POA: Diagnosis not present

## 2019-02-27 DIAGNOSIS — Z923 Personal history of irradiation: Secondary | ICD-10-CM | POA: Diagnosis not present

## 2019-02-27 DIAGNOSIS — C50912 Malignant neoplasm of unspecified site of left female breast: Secondary | ICD-10-CM | POA: Insufficient documentation

## 2019-02-27 DIAGNOSIS — Z90710 Acquired absence of both cervix and uterus: Secondary | ICD-10-CM | POA: Insufficient documentation

## 2019-02-27 DIAGNOSIS — Z79811 Long term (current) use of aromatase inhibitors: Secondary | ICD-10-CM | POA: Diagnosis not present

## 2019-02-27 DIAGNOSIS — Z79899 Other long term (current) drug therapy: Secondary | ICD-10-CM | POA: Diagnosis not present

## 2019-02-27 DIAGNOSIS — Z853 Personal history of malignant neoplasm of breast: Secondary | ICD-10-CM | POA: Diagnosis not present

## 2019-02-27 DIAGNOSIS — Z08 Encounter for follow-up examination after completed treatment for malignant neoplasm: Secondary | ICD-10-CM | POA: Diagnosis not present

## 2019-02-27 DIAGNOSIS — Z5181 Encounter for therapeutic drug level monitoring: Secondary | ICD-10-CM | POA: Diagnosis not present

## 2019-02-27 DIAGNOSIS — N951 Menopausal and female climacteric states: Secondary | ICD-10-CM | POA: Diagnosis not present

## 2019-02-27 DIAGNOSIS — E7800 Pure hypercholesterolemia, unspecified: Secondary | ICD-10-CM | POA: Insufficient documentation

## 2019-02-27 DIAGNOSIS — Z170 Estrogen receptor positive status [ER+]: Secondary | ICD-10-CM | POA: Insufficient documentation

## 2019-03-03 NOTE — Progress Notes (Signed)
Hematology/Oncology Consult note [facility]  [phone] [phone]  Patient Care Team: Ward, Honor Loh, MD as PCP - General (Obstetrics and Gynecology)   Name of the patient: Tabitha Tucker  [id number]  December 20, 1960   Date of visit: 03/03/19  Diagnosis- pathological prognostic stage IApT1c pN1MX invasive mammary carcinoma of the left breast ER PR positive HER-2/neu negative  Chief complaint/ Reason for visit-routine follow-up of breast cancer on Arimidex  Heme/Onc history: patient is a 58 year old Caucasian female who recently underwent a screening mammogram which showed 1.7 x 1.2 x 1.6 cm hypoechoic irregular area in her left breast at 1 o'clock position. No evidence of left axillary adenopathy. This was 5 biopsied and was invasive mammary carcinoma grade 2, ER greater than 90% positive PR 11 to 50% positive and HER-2/neu negative. Patient will be seeing surgery tomorrow. She has never had prior breast biopsies. She is G2 P2 L2. She has had a hysterectomy many years ago. No significant family history of breast cancer, ovarian cancer, colon gastric or pancreatic cancer.  Patient underwent lumpectomy with sentinel lymph node biopsy on 07/25/2018. Final pathology showed invasive mammary carcinoma 1.7 cm, grade 2 with negative margins. 2 out of 2 sentinel lymph nodes were positive for malignancy. One lymph node was involved by micrometastatic carcinoma 1.5 mm and the other lymph node was involved with macro metastatic disease 5 mm. There was focal microscopy extracapsular extension present which I verified with Dr. Reuel Derby from pathology. pT1c pN1A. Systemic CT scans denied by insurance.  She completed adjuvant radiation treatment  Patient underwent MammaPrint testing which came back as low risk with no significant benefit from adjuvant chemotherapy.  Patient started taking Arimidex in May 2020.  Interval history-overall patient is  tolerating Arimidex well except for mild intermittent hot flashes.  Appetite is good and she denies any other complaints at this time  ECOG PS- 0 Pain scale- 0   Review of systems- Review of Systems  Constitutional: Negative for chills, fever, malaise/fatigue and weight loss.  HENT: Negative for congestion, ear discharge and nosebleeds.   Eyes: Negative for blurred vision.  Respiratory: Negative for cough, hemoptysis, sputum production, shortness of breath and wheezing.   Cardiovascular: Negative for chest pain, palpitations, orthopnea and claudication.  Gastrointestinal: Negative for abdominal pain, blood in stool, constipation, diarrhea, heartburn, melena, nausea and vomiting.  Genitourinary: Negative for dysuria, flank pain, frequency, hematuria and urgency.  Musculoskeletal: Negative for back pain, joint pain and myalgias.  Skin: Negative for rash.  Neurological: Negative for dizziness, tingling, focal weakness, seizures, weakness and headaches.  Endo/Heme/Allergies: Does not bruise/bleed easily.       Hot flashes  Psychiatric/Behavioral: Negative for depression and suicidal ideas. The patient does not have insomnia.       No Known Allergies   Past Medical History:  Diagnosis Date  . Breast cancer ([place])   . High cholesterol   . Panic attacks   . Personal history of radiation therapy   . PONV (postoperative nausea and vomiting)      Past Surgical History:  Procedure Laterality Date  . ABDOMINAL HYSTERECTOMY     pt did keep ovaries  . APPENDECTOMY     ate age 25  . BREAST BIOPSY Left 07/10/2018   INVASIVE MAMMARY CARCINOMA  . BREAST LUMPECTOMY Left 07/25/2018   [place],   . BREAST LUMPECTOMY WITH NEEDLE LOCALIZATION AND AXILLARY SENTINEL LYMPH NODE BX Left 07/25/2018   Procedure: BREAST LUMPECTOMY WITH NEEDLE LOCALIZATION AND SENTINEL NODE  BX;  Surgeon: Benjamine Sprague, DO;  Location: [facility] ORS;  Service: General;  Laterality: Left;    Social History   Socioeconomic  History  . Marital status: Married    Spouse name: Not on file  . Number of children: Not on file  . Years of education: Not on file  . Highest education level: Not on file  Occupational History  . Not on file  Social Needs  . Financial resource strain: Not on file  . Food insecurity    Worry: Not on file    Inability: Not on file  . Transportation needs    Medical: Not on file    Non-medical: Not on file  Tobacco Use  . Smoking status: Never Smoker  . Smokeless tobacco: Never Used  Substance and Sexual Activity  . Alcohol use: Yes    Alcohol/week: 14.0 standard drinks    Types: 14 Glasses of wine per week    Comment: 2 glasses of wine every day  . Drug use: Not Currently    Types: Marijuana    Comment: marijuana when she was a teenager a few time  . Sexual activity: Yes  Lifestyle  . Physical activity    Days per week: Not on file    Minutes per session: Not on file  . Stress: Not on file  Relationships  . Social Herbalist on phone: Not on file    Gets together: Not on file    Attends religious service: Not on file    Active member of club or organization: Not on file    Attends meetings of clubs or organizations: Not on file    Relationship status: Not on file  . Intimate partner violence    Fear of current or ex partner: Not on file    Emotionally abused: Not on file    Physically abused: Not on file    Forced sexual activity: Not on file  Other Topics Concern  . Not on file  Social History Narrative  . Not on file    Family History  Problem Relation Age of Onset  . Hypertension Father   . Breast cancer Neg Hx      Current Outpatient Medications:  .  ALPRAZolam (XANAX) 0.25 MG tablet, Take 0.25 mg by mouth 2 (two) times daily as needed for anxiety., Disp: , Rfl:  .  anastrozole (ARIMIDEX) 1 MG tablet, TAKE ONE TABLET BY MOUTH EVERY DAY, Disp: 30 tablet, Rfl: 2 .  B Complex-C (SUPER B COMPLEX PO), Take 1 tablet by mouth daily., Disp: , Rfl:   .  Calcium Carbonate-Vitamin D3 (CALCIUM 600-D) 600-400 MG-UNIT TABS, Take 1 tablet by mouth 2 (two) times a day., Disp: , Rfl:  .  Garlic 1660 MG CAPS, Take 1,000 mg by mouth daily., Disp: , Rfl:  .  simvastatin (ZOCOR) 10 MG tablet, Take 10 mg by mouth daily., Disp: , Rfl:  .  ibuprofen (ADVIL,MOTRIN) 800 MG tablet, Take 1 tablet (800 mg total) by mouth every 8 (eight) hours as needed for mild pain or moderate pain. (Patient not taking: Reported on 02/27/2019), Disp: 30 tablet, Rfl: 0 .  magic mouthwash w/lidocaine SOLN, Take 5 mLs by mouth 4 (four) times daily as needed for mouth pain. (Patient not taking: Reported on 02/27/2019), Disp: 360 mL, Rfl: 0  Physical exam:  Vitals:   02/27/19 0939  BP: (!) 147/93  Pulse: 77  Temp: (!) 97.3 F (36.3 C)  Weight: 139 lb 9.6  oz (63.3 kg)   Physical Exam Constitutional:      General: She is not in acute distress. HENT:     Head: Normocephalic and atraumatic.  Eyes:     Pupils: Pupils are equal, round, and reactive to light.  Neck:     Musculoskeletal: Normal range of motion.  Cardiovascular:     Rate and Rhythm: Normal rate and regular rhythm.     Heart sounds: Normal heart sounds.  Pulmonary:     Effort: Pulmonary effort is normal.     Breath sounds: Normal breath sounds.  Abdominal:     General: Bowel sounds are normal.     Palpations: Abdomen is soft.  Skin:    General: Skin is warm and dry.  Neurological:     Mental Status: She is alert and oriented to person, place, and time.    Breast exam was performed in seated and lying down position. Patient is status post left lumpectomy with a well-healed surgical scar. No evidence of any palpable masses. No evidence of axillary adenopathy. No evidence of any palpable masses or lumps in the right breast. No evidence of right axillary adenopathy   CMP Latest Ref Rng & Units 11/11/2018  Glucose 70 - 99 mg/dL 94  BUN 6 - 20 mg/dL 15  Creatinine 0.44 - 1.00 mg/dL 0.93  Sodium 135 - 145  mmol/L 141  Potassium 3.5 - 5.1 mmol/L 4.3  Chloride 98 - 111 mmol/L 105  CO2 22 - 32 mmol/L 26  Calcium 8.9 - 10.3 mg/dL 9.3  Total Protein 6.5 - 8.1 g/dL 7.6  Total Bilirubin 0.3 - 1.2 mg/dL 0.6  Alkaline Phos 38 - 126 U/L 68  AST 15 - 41 U/L 24  ALT 0 - 44 U/L 16   CBC Latest Ref Rng & Units 11/11/2018  WBC 4.0 - 10.5 K/uL 4.5  Hemoglobin 12.0 - 15.0 g/dL 13.7  Hematocrit 36.0 - 46.0 % 39.7  Platelets 150 - 400 K/uL 223    Assessment and plan- Patient is a 58 y.o. female with pathological prognostic stage Ia invasive mammary carcinoma of the left breast pT1 pN1 cM0 ER PR positive HER-2/neu negative status post lumpectomy and adjuvant radiation treatment currently on Arimidex.  She is here for routine follow-up of breast cancer  Overall patient is tolerating her Arimidex well except for mild intermittent self-limited hot flashes which we will continue to monitor. She will continue to take Arimidex along with calcium and vitamin D for a total.  Of 5 years.  She did undergo a bone density scan in June 2020 which was normal.  Clinically she is doing well and no concern for recurrence based on today's exam.  I will see her back in 6 months no labs   Visit Diagnosis 1. Encounter for follow-up surveillance of breast cancer   2. Visit for monitoring Arimidex therapy      Dr. Randa Evens, MD, MPH [facility] at [facility] [phone] 03/03/2019 2:02 PM

## 2019-03-04 ENCOUNTER — Encounter: Payer: Self-pay | Admitting: Oncology

## 2019-03-21 ENCOUNTER — Other Ambulatory Visit: Payer: Self-pay | Admitting: *Deleted

## 2019-03-21 MED ORDER — ANASTROZOLE 1 MG PO TABS: 1.0000 mg | ORAL_TABLET | Freq: Every day | ORAL | 1 refills | Status: DC

## 2019-05-06 ENCOUNTER — Encounter: Payer: Self-pay | Admitting: Radiation Oncology

## 2019-05-06 ENCOUNTER — Ambulatory Visit
Admission: RE | Admit: 2019-05-06 | Discharge: 2019-05-06 | Disposition: A | Discharge status: Home / Self Care | Payer: BC Managed Care – PPO | Source: Ambulatory Visit | Attending: Radiation Oncology | Admitting: Radiation Oncology

## 2019-05-06 ENCOUNTER — Other Ambulatory Visit: Payer: Self-pay

## 2019-05-06 VITALS — BP 138/93 | HR 78 | Temp 98.2°F | Resp 16 | Wt 140.5 lb

## 2019-05-06 DIAGNOSIS — Z79811 Long term (current) use of aromatase inhibitors: Secondary | ICD-10-CM | POA: Insufficient documentation

## 2019-05-06 DIAGNOSIS — C50412 Malignant neoplasm of upper-outer quadrant of left female breast: Secondary | ICD-10-CM

## 2019-05-06 DIAGNOSIS — Z923 Personal history of irradiation: Secondary | ICD-10-CM | POA: Insufficient documentation

## 2019-05-06 DIAGNOSIS — Z170 Estrogen receptor positive status [ER+]: Secondary | ICD-10-CM | POA: Diagnosis not present

## 2019-05-06 NOTE — Progress Notes (Signed)
Radiation Oncology Follow up Note  Name: Tabitha Tucker   Date:   05/06/2019 MRN:  [id number] DOB: [birth year]    This 58 y.o. female presents to the clinic today for 21-month follow-up status post whole breast radiation to her left breast for stage I ER/PR positive invasive mammary carcinoma.  REFERRING PROVIDER: Ward, Honor Loh, MD  HPI: Patient is a 58 year old female now about 6 months having completed whole breast radiation to her left breast for stage I ER/PR positive invasive mammary carcinoma.  Seen today in routine follow-up she is doing well.  She specifically denies breast tenderness cough or bone pain.  She is currently on.  Arimidex tolerating that well without side effect.  She has not had mammography at this point.  COMPLICATIONS OF TREATMENT: none  FOLLOW UP COMPLIANCE: keeps appointments   PHYSICAL EXAM:  BP (!) 138/93 (BP Location: Left Arm, Patient Position: Sitting)   Pulse 78   Temp 98.2 F (36.8 C) (Tympanic)   Resp 16   Wt 140 lb 8 oz (63.7 kg)   BMI 24.12 kg/m  Lungs are clear to A&P cardiac examination essentially unremarkable with regular rate and rhythm. No dominant mass or nodularity is noted in either breast in 2 positions examined. Incision is well-healed. No axillary or supraclavicular adenopathy is appreciated. Cosmetic result is excellent.  Well-developed well-nourished patient in NAD. HEENT reveals PERLA, EOMI, discs not visualized.  Oral cavity is clear. No oral mucosal lesions are identified. Neck is clear without evidence of cervical or supraclavicular adenopathy. Lungs are clear to A&P. Cardiac examination is essentially unremarkable with regular rate and rhythm without murmur rub or thrill. Abdomen is benign with no organomegaly or masses noted. Motor sensory and DTR levels are equal and symmetric in the upper and lower extremities. Cranial nerves II through XII are grossly intact. Proprioception is intact. No peripheral adenopathy or edema is identified. No  motor or sensory levels are noted. Crude visual fields are within normal range.  RADIOLOGY RESULTS: No current films to review  PLAN: At the present time patient is doing well with no evidence of disease 6 months out.  She continues to do well with low side effect profile.  I am pleased with her overall progress.  Of asked to see her back in 6 months for follow-up and will then start once year follow-up visits.  She continues on Arimidex without side effect.  She is already scheduled for follow-up mammograms in December.  Patient knows to call with any concerns.  I would like to take this opportunity to thank you for allowing me to participate in the care of your patient.Noreene Filbert, MD

## 2019-06-04 ENCOUNTER — Other Ambulatory Visit: Payer: Self-pay

## 2019-06-04 ENCOUNTER — Encounter: Payer: Self-pay | Admitting: Oncology

## 2019-06-04 NOTE — Progress Notes (Signed)
Patient stated that she had been doing well with no complaints of her bilateral breasts. Patient stated that her PCP told her that she needed to have her mammogram ordered. Her last mammogram was on July 05, 2018. Patient stated that she is able to fall asleep but not able to stay asleep.

## 2019-06-05 ENCOUNTER — Inpatient Hospital Stay: Payer: BC Managed Care – PPO | Attending: Oncology | Admitting: Oncology

## 2019-06-05 DIAGNOSIS — Z170 Estrogen receptor positive status [ER+]: Secondary | ICD-10-CM

## 2019-06-05 DIAGNOSIS — Z79811 Long term (current) use of aromatase inhibitors: Secondary | ICD-10-CM

## 2019-06-05 DIAGNOSIS — Z5181 Encounter for therapeutic drug level monitoring: Secondary | ICD-10-CM

## 2019-06-05 DIAGNOSIS — F419 Anxiety disorder, unspecified: Secondary | ICD-10-CM

## 2019-06-05 DIAGNOSIS — Z79899 Other long term (current) drug therapy: Secondary | ICD-10-CM

## 2019-06-05 DIAGNOSIS — C50412 Malignant neoplasm of upper-outer quadrant of left female breast: Secondary | ICD-10-CM

## 2019-06-05 DIAGNOSIS — E875 Hyperkalemia: Secondary | ICD-10-CM

## 2019-06-06 NOTE — Progress Notes (Signed)
I connected with Tabitha Tucker on 06/06/19 at 10:00 AM EST by video enabled telemedicine visit and verified that I am speaking with the correct person using two identifiers.   I discussed the limitations, risks, security and privacy concerns of performing an evaluation and management service by telemedicine and the availability of in-person appointments. I also discussed with the patient that there may be a patient responsible charge related to this service. The patient expressed understanding and agreed to proceed.  Other persons participating in the visit and their role in the encounter:  none  Patient's location:  home Provider's location:  Work  Diagnosis- pathological prognostic stage IApT1c pN1MX invasive mammary carcinoma of the left breast ER PR positive HER-2/neu negative   Chief Complaint: Routine follow-up of breast cancer on Arimidex  History of present illness: patient is a 58 year old Caucasian female who recently underwent a screening mammogram which showed 1.7 x 1.2 x 1.6 cm hypoechoic irregular area in her left breast at 1 o'clock position. No evidence of left axillary adenopathy. This was 5 biopsied and was invasive mammary carcinoma grade 2, ER greater than 90% positive PR 11 to 50% positive and HER-2/neu negative. Patient will be seeing surgery tomorrow. She has never had prior breast biopsies. She is G2 P2 L2. She has had a hysterectomy many years ago. No significant family history of breast cancer, ovarian cancer, colon gastric or pancreatic cancer.  Patient underwent lumpectomy with sentinel lymph node biopsy on 07/25/2018. Final pathology showed invasive mammary carcinoma 1.7 cm, grade 2 with negative margins. 2 out of 2 sentinel lymph nodes were positive for malignancy. One lymph node was involved by micrometastatic carcinoma 1.5 mm and the other lymph node was involved with macro metastatic disease 5 mm. There was focal microscopy extracapsular extension present  which I verified with Dr. Reuel Derby from pathology. pT1c pN1A. Systemic CT scans denied by insurance.She completed adjuvant radiation treatment  Patient underwent MammaPrint testing which came back as low risk with no significant benefit from adjuvant chemotherapy.  Patient started taking Arimidex in May 2020.  Interval history : Patient reports tolerating Arimidex well and denies any significant hot flashes fatigue mood swings or arthralgias.  She is also consistently taking her calcium and vitamin D.   Review of Systems  Constitutional: Negative for chills, fever, malaise/fatigue and weight loss.  HENT: Negative for congestion, ear discharge and nosebleeds.   Eyes: Negative for blurred vision.  Respiratory: Negative for cough, hemoptysis, sputum production, shortness of breath and wheezing.   Cardiovascular: Negative for chest pain, palpitations, orthopnea and claudication.  Gastrointestinal: Negative for abdominal pain, blood in stool, constipation, diarrhea, heartburn, melena, nausea and vomiting.  Genitourinary: Negative for dysuria, flank pain, frequency, hematuria and urgency.  Musculoskeletal: Negative for back pain, joint pain and myalgias.  Skin: Negative for rash.  Neurological: Negative for dizziness, tingling, focal weakness, seizures, weakness and headaches.  Endo/Heme/Allergies: Does not bruise/bleed easily.  Psychiatric/Behavioral: Negative for depression and suicidal ideas. The patient does not have insomnia.     No Known Allergies  Past Medical History:  Diagnosis Date  . Breast cancer ([place])   . High cholesterol   . Panic attacks   . Personal history of radiation therapy   . PONV (postoperative nausea and vomiting)     Past Surgical History:  Procedure Laterality Date  . ABDOMINAL HYSTERECTOMY     pt did keep ovaries  . APPENDECTOMY     ate age 75  . BREAST BIOPSY Left 07/10/2018   INVASIVE  MAMMARY CARCINOMA  . BREAST LUMPECTOMY Left 07/25/2018   [place],    . BREAST LUMPECTOMY WITH NEEDLE LOCALIZATION AND AXILLARY SENTINEL LYMPH NODE BX Left 07/25/2018   Procedure: BREAST LUMPECTOMY WITH NEEDLE LOCALIZATION AND SENTINEL NODE BX;  Surgeon: Benjamine Sprague, DO;  Location: [facility] ORS;  Service: General;  Laterality: Left;    Social History   Socioeconomic History  . Marital status: Married    Spouse name: Not on file  . Number of children: Not on file  . Years of education: Not on file  . Highest education level: Not on file  Occupational History  . Not on file  Social Needs  . Financial resource strain: Not on file  . Food insecurity    Worry: Not on file    Inability: Not on file  . Transportation needs    Medical: Not on file    Non-medical: Not on file  Tobacco Use  . Smoking status: Never Smoker  . Smokeless tobacco: Never Used  Substance and Sexual Activity  . Alcohol use: Yes    Alcohol/week: 14.0 standard drinks    Types: 14 Glasses of wine per week    Comment: 2 glasses of wine every day  . Drug use: Not Currently    Types: Marijuana    Comment: marijuana when she was a teenager a few time  . Sexual activity: Yes  Lifestyle  . Physical activity    Days per week: Not on file    Minutes per session: Not on file  . Stress: Not on file  Relationships  . Social Herbalist on phone: Not on file    Gets together: Not on file    Attends religious service: Not on file    Active member of club or organization: Not on file    Attends meetings of clubs or organizations: Not on file    Relationship status: Not on file  . Intimate partner violence    Fear of current or ex partner: Not on file    Emotionally abused: Not on file    Physically abused: Not on file    Forced sexual activity: Not on file  Other Topics Concern  . Not on file  Social History Narrative  . Not on file    Family History  Problem Relation Age of Onset  . Hypertension Father   . Breast cancer Neg Hx      Current Outpatient Medications:   .  ALPRAZolam (XANAX) 0.25 MG tablet, Take 0.25 mg by mouth 2 (two) times daily as needed for anxiety., Disp: , Rfl:  .  anastrozole (ARIMIDEX) 1 MG tablet, Take 1 tablet (1 mg total) by mouth daily., Disp: 90 tablet, Rfl: 1 .  B Complex-C (SUPER B COMPLEX PO), Take 1 tablet by mouth daily., Disp: , Rfl:  .  Calcium Carbonate-Vitamin D3 (CALCIUM 600-D) 600-400 MG-UNIT TABS, Take 1 tablet by mouth 2 (two) times a day., Disp: , Rfl:  .  Garlic 1749 MG CAPS, Take 1,000 mg by mouth daily., Disp: , Rfl:  .  magic mouthwash w/lidocaine SOLN, Take 5 mLs by mouth 4 (four) times daily as needed for mouth pain., Disp: 360 mL, Rfl: 0 .  simvastatin (ZOCOR) 10 MG tablet, Take 10 mg by mouth daily., Disp: , Rfl:  .  ibuprofen (ADVIL,MOTRIN) 800 MG tablet, Take 1 tablet (800 mg total) by mouth every 8 (eight) hours as needed for mild pain or moderate pain. (Patient not taking: Reported on  06/04/2019), Disp: 30 tablet, Rfl: 0  No results found.  No images are attached to the encounter.   CMP Latest Ref Rng & Units 11/11/2018  Glucose 70 - 99 mg/dL 94  BUN 6 - 20 mg/dL 15  Creatinine 0.44 - 1.00 mg/dL 0.93  Sodium 135 - 145 mmol/L 141  Potassium 3.5 - 5.1 mmol/L 4.3  Chloride 98 - 111 mmol/L 105  CO2 22 - 32 mmol/L 26  Calcium 8.9 - 10.3 mg/dL 9.3  Total Protein 6.5 - 8.1 g/dL 7.6  Total Bilirubin 0.3 - 1.2 mg/dL 0.6  Alkaline Phos 38 - 126 U/L 68  AST 15 - 41 U/L 24  ALT 0 - 44 U/L 16   CBC Latest Ref Rng & Units 11/11/2018  WBC 4.0 - 10.5 K/uL 4.5  Hemoglobin 12.0 - 15.0 g/dL 13.7  Hematocrit 36.0 - 46.0 % 39.7  Platelets 150 - 400 K/uL 223     Observation/objective: Appears in no acute distress of a video visit today.  Breathing is nonlabored  Assessment and plan:Patient is a 58 y.o. female with pathological prognostic stage Ia invasive mammary carcinoma of the left breast pT1 pN1 cM0 ER PR positive HER-2/neu negative status post lumpectomy and adjuvant radiation treatment currently on  Arimidex.    This is a routine follow-up visit for breast cancer  Overall patient is tolerating Arimidex along with calcium and vitamin D well.  Reports no significant side effects at this time.  She will continue to take that for 5 years.  Clinically no concerning symptoms of recurrence. she will be due for repeat mammogram this month which I will schedule.    Follow-up instructions:I will see her back in 6 months  I discussed the assessment and treatment plan with the patient. The patient was provided an opportunity to ask questions and all were answered. The patient agreed with the plan and demonstrated an understanding of the instructions.   The patient was advised to call back or seek an in-person evaluation if the symptoms worsen or if the condition fails to improve as anticipated.    Visit Diagnosis: 1. Malignant neoplasm of upper-outer quadrant of left breast in female, estrogen receptor positive ([place])     Dr. Randa Evens, MD, MPH [facility] at [facility] Pager- [phone] 06/06/2019 11:52 AM

## 2019-06-23 ENCOUNTER — Ambulatory Visit
Admission: RE | Admit: 2019-06-23 | Discharge: 2019-06-23 | Disposition: A | Discharge status: Home / Self Care | Payer: BC Managed Care – PPO | Source: Ambulatory Visit | Attending: Oncology | Admitting: Oncology

## 2019-06-23 DIAGNOSIS — C50412 Malignant neoplasm of upper-outer quadrant of left female breast: Secondary | ICD-10-CM

## 2019-06-23 DIAGNOSIS — Z170 Estrogen receptor positive status [ER+]: Secondary | ICD-10-CM | POA: Insufficient documentation

## 2019-09-02 ENCOUNTER — Ambulatory Visit: Payer: BC Managed Care – PPO | Admitting: Oncology

## 2019-10-15 ENCOUNTER — Other Ambulatory Visit: Payer: Self-pay | Admitting: *Deleted

## 2019-10-15 MED ORDER — ANASTROZOLE 1 MG PO TABS: 1.0000 mg | ORAL_TABLET | Freq: Every day | ORAL | 0 refills | Status: DC

## 2019-11-05 ENCOUNTER — Other Ambulatory Visit: Payer: Self-pay

## 2019-11-05 ENCOUNTER — Ambulatory Visit
Admission: RE | Admit: 2019-11-05 | Discharge: 2019-11-05 | Disposition: A | Discharge status: Home / Self Care | Payer: BC Managed Care – PPO | Source: Ambulatory Visit | Attending: Radiation Oncology | Admitting: Radiation Oncology

## 2019-11-05 VITALS — BP 143/93 | HR 86 | Temp 97.5°F | Resp 16 | Wt 142.2 lb

## 2019-11-05 DIAGNOSIS — C50412 Malignant neoplasm of upper-outer quadrant of left female breast: Secondary | ICD-10-CM | POA: Diagnosis not present

## 2019-11-05 DIAGNOSIS — Z170 Estrogen receptor positive status [ER+]: Secondary | ICD-10-CM | POA: Insufficient documentation

## 2019-11-05 DIAGNOSIS — Z79811 Long term (current) use of aromatase inhibitors: Secondary | ICD-10-CM | POA: Diagnosis not present

## 2019-11-05 DIAGNOSIS — Z923 Personal history of irradiation: Secondary | ICD-10-CM | POA: Diagnosis not present

## 2019-11-05 NOTE — Progress Notes (Signed)
Radiation Oncology Follow up Note  Name: Tabitha Tucker   Date:   11/05/2019 MRN:  [id number] DOB: [birth year]    This 59 y.o. female presents to the clinic today for 1 year follow-up status post whole breast radiation to her left breast for stage I ER/PR positive invasive mammary carcinoma.  REFERRING PROVIDER: Ward, Honor Loh, MD  HPI: Patient is a 59 year old female now at 1 year having completed whole breast radiation to her left breast for stage I ER/PR positive invasive mammary carcinoma.  Seen today in routine follow-up she is doing well.  She specifically denies breast tenderness cough or bone pain..  She had mammograms back in December which I have reviewed were BI-RADS 2 benign.  She is currently on Arimidex tolerating that well without side effect.  COMPLICATIONS OF TREATMENT: none  FOLLOW UP COMPLIANCE: keeps appointments   PHYSICAL EXAM:  BP (!) 143/93   Pulse 86   Temp (!) 97.5 F (36.4 C)   Resp 16   Wt 142 lb 3.2 oz (64.5 kg)   SpO2 98%   BMI 24.41 kg/m  Lungs are clear to A&P cardiac examination essentially unremarkable with regular rate and rhythm. No dominant mass or nodularity is noted in either breast in 2 positions examined. Incision is well-healed. No axillary or supraclavicular adenopathy is appreciated. Cosmetic result is excellent.  Well-developed well-nourished patient in NAD. HEENT reveals PERLA, EOMI, discs not visualized.  Oral cavity is clear. No oral mucosal lesions are identified. Neck is clear without evidence of cervical or supraclavicular adenopathy. Lungs are clear to A&P. Cardiac examination is essentially unremarkable with regular rate and rhythm without murmur rub or thrill. Abdomen is benign with no organomegaly or masses noted. Motor sensory and DTR levels are equal and symmetric in the upper and lower extremities. Cranial nerves II through XII are grossly intact. Proprioception is intact. No peripheral adenopathy or edema is identified. No motor or  sensory levels are noted. Crude visual fields are within normal range.  RADIOLOGY RESULTS: Mammograms reviewed compatible with above-stated findings  PLAN: Present time patient is doing well 1 year out with no evidence of disease.  I am pleased with her overall progress.  I have asked to see her back in 1 year for follow-up.  She continues on Arimidex without side effect.  Patient knows to call at anytime with any concerns.  I would like to take this opportunity to thank you for allowing me to participate in the care of your patient.Noreene Filbert, MD

## 2019-12-04 ENCOUNTER — Inpatient Hospital Stay: Payer: BC Managed Care – PPO | Admitting: Oncology

## 2019-12-04 ENCOUNTER — Telehealth: Payer: Self-pay | Admitting: Oncology

## 2019-12-04 ENCOUNTER — Encounter: Payer: Self-pay | Admitting: Oncology

## 2019-12-04 NOTE — Progress Notes (Unsigned)
Patient called for oncology mychart  appointment, expresses concerns of leg cramps at night, numbness in hand and sore breast at times.

## 2019-12-04 NOTE — Telephone Encounter (Signed)
Writer received message from MD that MD would like to see patient in person instead of a virtual visit. Writer phoned patient on this date and rescheduled patient to see MD in person on 12-08-19.

## 2019-12-08 ENCOUNTER — Inpatient Hospital Stay: Payer: BC Managed Care – PPO | Attending: Oncology | Admitting: Oncology

## 2019-12-08 ENCOUNTER — Inpatient Hospital Stay: Payer: BC Managed Care – PPO

## 2019-12-08 ENCOUNTER — Other Ambulatory Visit: Payer: Self-pay

## 2019-12-08 VITALS — BP 132/96 | HR 82 | Temp 96.6°F | Resp 18 | Wt 142.4 lb

## 2019-12-08 DIAGNOSIS — Z08 Encounter for follow-up examination after completed treatment for malignant neoplasm: Secondary | ICD-10-CM

## 2019-12-08 DIAGNOSIS — Z170 Estrogen receptor positive status [ER+]: Secondary | ICD-10-CM | POA: Diagnosis not present

## 2019-12-08 DIAGNOSIS — Z8249 Family history of ischemic heart disease and other diseases of the circulatory system: Secondary | ICD-10-CM | POA: Diagnosis not present

## 2019-12-08 DIAGNOSIS — S5010XA Contusion of unspecified forearm, initial encounter: Secondary | ICD-10-CM | POA: Diagnosis not present

## 2019-12-08 DIAGNOSIS — R233 Spontaneous ecchymoses: Secondary | ICD-10-CM

## 2019-12-08 DIAGNOSIS — C50412 Malignant neoplasm of upper-outer quadrant of left female breast: Secondary | ICD-10-CM | POA: Diagnosis not present

## 2019-12-08 DIAGNOSIS — R238 Other skin changes: Secondary | ICD-10-CM

## 2019-12-08 DIAGNOSIS — Z7289 Other problems related to lifestyle: Secondary | ICD-10-CM | POA: Insufficient documentation

## 2019-12-08 DIAGNOSIS — Z79899 Other long term (current) drug therapy: Secondary | ICD-10-CM | POA: Diagnosis not present

## 2019-12-08 DIAGNOSIS — Z5181 Encounter for therapeutic drug level monitoring: Secondary | ICD-10-CM | POA: Diagnosis not present

## 2019-12-08 DIAGNOSIS — S8010XA Contusion of unspecified lower leg, initial encounter: Secondary | ICD-10-CM | POA: Diagnosis not present

## 2019-12-08 DIAGNOSIS — Z853 Personal history of malignant neoplasm of breast: Secondary | ICD-10-CM | POA: Diagnosis not present

## 2019-12-08 DIAGNOSIS — Z79811 Long term (current) use of aromatase inhibitors: Secondary | ICD-10-CM

## 2019-12-08 LAB — CBC WITH DIFFERENTIAL/PLATELET
Abs Immature Granulocytes: 0.01 10*3/uL (ref 0.00–0.07)
Basophils Absolute: 0 10*3/uL (ref 0.0–0.1)
Basophils Relative: 1 %
Eosinophils Absolute: 0.1 10*3/uL (ref 0.0–0.5)
Eosinophils Relative: 1 %
HCT: 40.9 % (ref 36.0–46.0)
Hemoglobin: 13.9 g/dL (ref 12.0–15.0)
Immature Granulocytes: 0 %
Lymphocytes Relative: 19 %
Lymphs Abs: 1 10*3/uL (ref 0.7–4.0)
MCH: 32.7 pg (ref 26.0–34.0)
MCHC: 34 g/dL (ref 30.0–36.0)
MCV: 96.2 fL (ref 80.0–100.0)
Monocytes Absolute: 0.4 10*3/uL (ref 0.1–1.0)
Monocytes Relative: 7 %
Neutro Abs: 3.7 10*3/uL (ref 1.7–7.7)
Neutrophils Relative %: 72 %
Platelets: 270 10*3/uL (ref 150–400)
RBC: 4.25 MIL/uL (ref 3.87–5.11)
RDW: 11.8 % (ref 11.5–15.5)
WBC: 5.2 10*3/uL (ref 4.0–10.5)
nRBC: 0 % (ref 0.0–0.2)

## 2019-12-08 LAB — FIBRINOGEN: Fibrinogen: 388 mg/dL (ref 210–475)

## 2019-12-08 LAB — PROTIME-INR
INR: 0.9 (ref 0.8–1.2)
Prothrombin Time: 12.1 seconds (ref 11.4–15.2)

## 2019-12-08 LAB — APTT: aPTT: 26 seconds (ref 24–36)

## 2019-12-10 ENCOUNTER — Encounter: Payer: Self-pay | Admitting: Oncology

## 2019-12-10 LAB — VON WILLEBRAND PANEL
Coagulation Factor VIII: 181 % — ABNORMAL HIGH (ref 56–140)
Ristocetin Co-factor, Plasma: 160 % (ref 50–200)
Von Willebrand Antigen, Plasma: 147 % (ref 50–200)

## 2019-12-10 LAB — COAG STUDIES INTERP REPORT

## 2019-12-10 NOTE — Progress Notes (Signed)
Hematology/Oncology Consult note [facility]  [phone] [phone]  Patient Care Team: Ward, Honor Loh, MD as PCP - General (Obstetrics and Gynecology)   Name of the patient: Tabitha Tucker  [id number]  05/02/1961   Date of visit: 12/10/19  Diagnosis- pathological prognostic stage IApT1c pN1MX invasive mammary carcinoma of the left breast ER PR positive HER-2/neu negative   Chief complaint/ Reason for visit-routine follow-up of breast cancer  Heme/Onc history:  patient is a 59 year old Caucasian female who recently underwent a screening mammogram which showed 1.7 x 1.2 x 1.6 cm hypoechoic irregular area in her left breast at 1 o'clock position. No evidence of left axillary adenopathy. This was 5 biopsied and was invasive mammary carcinoma grade 2, ER greater than 90% positive PR 11 to 50% positive and HER-2/neu negative. Patient will be seeing surgery tomorrow. She has never had prior breast biopsies. She is G2 P2 L2. She has had a hysterectomy many years ago. No significant family history of breast cancer, ovarian cancer, colon gastric or pancreatic cancer.  Patient underwent lumpectomy with sentinel lymph node biopsy on 07/25/2018. Final pathology showed invasive mammary carcinoma 1.7 cm, grade 2 with negative margins. 2 out of 2 sentinel lymph nodes were positive for malignancy. One lymph node was involved by micrometastatic carcinoma 1.5 mm and the other lymph node was involved with macro metastatic disease 5 mm. There was focal microscopy extracapsular extension present which I verified with Dr. Reuel Derby from pathology. pT1c pN1A. Systemic CT scans denied by insurance.She completed adjuvant radiation treatment  Patient underwent MammaPrint testing which came back as low risk with no significant benefit from adjuvant chemotherapy.Patient started taking Arimidex in May 2020.   Interval history-patient feels well and is  tolerating Arimidex without any significant side effects.  She is also taking her calcium and vitamin D.  Reports having random areas of bruising which have been ongoing for the last 1 month.  These are small areas which come and go and not precipitated by any trauma.  She has not had any bleeding events in the past.  ECOG PS- 0 Pain scale- 0   Review of systems- Review of Systems  Constitutional: Negative for chills, fever, malaise/fatigue and weight loss.  HENT: Negative for congestion, ear discharge and nosebleeds.   Eyes: Negative for blurred vision.  Respiratory: Negative for cough, hemoptysis, sputum production, shortness of breath and wheezing.   Cardiovascular: Negative for chest pain, palpitations, orthopnea and claudication.  Gastrointestinal: Negative for abdominal pain, blood in stool, constipation, diarrhea, heartburn, melena, nausea and vomiting.  Genitourinary: Negative for dysuria, flank pain, frequency, hematuria and urgency.  Musculoskeletal: Negative for back pain, joint pain and myalgias.  Skin: Negative for rash.  Neurological: Negative for dizziness, tingling, focal weakness, seizures, weakness and headaches.  Endo/Heme/Allergies: Bruises/bleeds easily.  Psychiatric/Behavioral: Negative for depression and suicidal ideas. The patient does not have insomnia.       No Known Allergies   Past Medical History:  Diagnosis Date  . Breast cancer ([place]) 07/2018  . High cholesterol   . Panic attacks   . Personal history of radiation therapy   . PONV (postoperative nausea and vomiting)      Past Surgical History:  Procedure Laterality Date  . ABDOMINAL HYSTERECTOMY     pt did keep ovaries  . APPENDECTOMY     ate age 66  . BREAST BIOPSY Left 07/10/2018   INVASIVE MAMMARY CARCINOMA  . BREAST LUMPECTOMY Left 07/25/2018   [place],   .  BREAST LUMPECTOMY WITH NEEDLE LOCALIZATION AND AXILLARY SENTINEL LYMPH NODE BX Left 07/25/2018   Procedure: BREAST LUMPECTOMY WITH NEEDLE  LOCALIZATION AND SENTINEL NODE BX;  Surgeon: Benjamine Sprague, DO;  Location: [facility] ORS;  Service: General;  Laterality: Left;    Social History   Socioeconomic History  . Marital status: Married    Spouse name: Not on file  . Number of children: Not on file  . Years of education: Not on file  . Highest education level: Not on file  Occupational History  . Not on file  Tobacco Use  . Smoking status: Never Smoker  . Smokeless tobacco: Never Used  Substance and Sexual Activity  . Alcohol use: Yes    Alcohol/week: 14.0 standard drinks    Types: 14 Glasses of wine per week    Comment: 2 glasses of wine every day  . Drug use: Not Currently    Types: Marijuana    Comment: marijuana when she was a teenager a few time  . Sexual activity: Yes  Other Topics Concern  . Not on file  Social History Narrative  . Not on file   Social Determinants of Health   Financial Resource Strain:   . Difficulty of Paying Living Expenses:   Food Insecurity:   . Worried About Charity fundraiser in the Last Year:   . Arboriculturist in the Last Year:   Transportation Needs:   . Film/video editor (Medical):   Marland Kitchen Lack of Transportation (Non-Medical):   Physical Activity:   . Days of Exercise per Week:   . Minutes of Exercise per Session:   Stress:   . Feeling of Stress :   Social Connections:   . Frequency of Communication with Friends and Family:   . Frequency of Social Gatherings with Friends and Family:   . Attends Religious Services:   . Active Member of Clubs or Organizations:   . Attends Archivist Meetings:   Marland Kitchen Marital Status:   Intimate Partner Violence:   . Fear of Current or Ex-Partner:   . Emotionally Abused:   Marland Kitchen Physically Abused:   . Sexually Abused:     Family History  Problem Relation Age of Onset  . Hypertension Father   . Breast cancer Neg Hx      Current Outpatient Medications:  .  ALPRAZolam (XANAX) 0.25 MG tablet, Take 0.25 mg by mouth 2 (two) times  daily as needed for anxiety., Disp: , Rfl:  .  anastrozole (ARIMIDEX) 1 MG tablet, Take 1 tablet (1 mg total) by mouth daily., Disp: 90 tablet, Rfl: 0 .  B Complex-C (SUPER B COMPLEX PO), Take 1 tablet by mouth daily., Disp: , Rfl:  .  Calcium Carbonate-Vit D-Min (CALCIUM 600+D PLUS MINERALS) 600-400 MG-UNIT TABS, Take by mouth., Disp: , Rfl:  .  Calcium Carbonate-Vitamin D3 (CALCIUM 600-D) 600-400 MG-UNIT TABS, Take 1 tablet by mouth 2 (two) times a day., Disp: , Rfl:  .  Garlic 7412 MG CAPS, Take 1,000 mg by mouth daily., Disp: , Rfl:  .  simvastatin (ZOCOR) 10 MG tablet, Take 10 mg by mouth daily., Disp: , Rfl:   Physical exam:  Vitals:   12/08/19 1123  BP: (!) 132/96  Pulse: 82  Resp: 18  Temp: (!) 96.6 F (35.9 C)  TempSrc: Tympanic  SpO2: 99%  Weight: 142 lb 6.4 oz (64.6 kg)   Physical Exam Constitutional:      General: She is not in acute distress. Cardiovascular:  Rate and Rhythm: Normal rate and regular rhythm.     Heart sounds: Normal heart sounds.  Pulmonary:     Effort: Pulmonary effort is normal.     Breath sounds: Normal breath sounds.  Abdominal:     General: Bowel sounds are normal.     Palpations: Abdomen is soft.  Skin:    General: Skin is warm and dry.     Comments: 2-3 areas of small bruises noted over leg and forearm  Neurological:     Mental Status: She is alert and oriented to person, place, and time.    Breast exam was performed in seated and lying down position. Patient is status post left lumpectomy with a well-healed surgical scar. No evidence of any palpable masses. No evidence of axillary adenopathy. No evidence of any palpable masses or lumps in the right breast. No evidence of right axillary adenopathy   CMP Latest Ref Rng & Units 11/11/2018  Glucose 70 - 99 mg/dL 94  BUN 6 - 20 mg/dL 15  Creatinine 0.44 - 1.00 mg/dL 0.93  Sodium 135 - 145 mmol/L 141  Potassium 3.5 - 5.1 mmol/L 4.3  Chloride 98 - 111 mmol/L 105  CO2 22 - 32 mmol/L 26    Calcium 8.9 - 10.3 mg/dL 9.3  Total Protein 6.5 - 8.1 g/dL 7.6  Total Bilirubin 0.3 - 1.2 mg/dL 0.6  Alkaline Phos 38 - 126 U/L 68  AST 15 - 41 U/L 24  ALT 0 - 44 U/L 16   CBC Latest Ref Rng & Units 12/08/2019  WBC 4.0 - 10.5 K/uL 5.2  Hemoglobin 12.0 - 15.0 g/dL 13.9  Hematocrit 36.0 - 46.0 % 40.9  Platelets 150 - 400 K/uL 270     Assessment and plan- Patient is a 59 y.o. female with pathological prognostic stage Ia invasive mammary carcinoma of the left breast pT1 pN1 cM0 ER PR positive HER-2/neu negative status post lumpectomy and adjuvant radiation treatment currently on Arimidex.She is here for routine follow-up of breast cancer on Arimidex  Clinically patient is doing well and no concerning signs and symptoms of recurrence based on today's exam.  She is tolerating Arimidex along with calcium and vitamin D well.  She will continue to take this for at least 5 years.   Easy bruising: No prior history of bleeding.  No family history of any bleeding disorder.  No significant bleeding after hemostatic challenges such as prior surgeries. Today I will check CBC with differential, PT PT/INR, von Willebrand panel, fibrinogen time and platelet function assay.  I will see her back in 6 months time.  She will call us sooner if her breathing gets worse   Visit Diagnosis 1. Easy bruising   2. Encounter for follow-up surveillance of breast cancer   3. Visit for monitoring Arimidex therapy      Dr. Randa Evens, MD, MPH [facility] at [facility] [phone] 12/10/2019 9:42 AM

## 2019-12-11 ENCOUNTER — Encounter: Payer: Self-pay | Admitting: Oncology

## 2020-01-15 ENCOUNTER — Other Ambulatory Visit: Payer: Self-pay | Admitting: Oncology

## 2020-01-20 ENCOUNTER — Other Ambulatory Visit: Payer: Self-pay

## 2020-01-20 ENCOUNTER — Ambulatory Visit (INDEPENDENT_AMBULATORY_CARE_PROVIDER_SITE_OTHER): Payer: BC Managed Care – PPO | Admitting: Dermatology

## 2020-01-20 DIAGNOSIS — L814 Other melanin hyperpigmentation: Secondary | ICD-10-CM

## 2020-01-20 DIAGNOSIS — I781 Nevus, non-neoplastic: Secondary | ICD-10-CM

## 2020-01-20 DIAGNOSIS — L821 Other seborrheic keratosis: Secondary | ICD-10-CM

## 2020-01-20 DIAGNOSIS — L820 Inflamed seborrheic keratosis: Secondary | ICD-10-CM

## 2020-01-20 DIAGNOSIS — D492 Neoplasm of unspecified behavior of bone, soft tissue, and skin: Secondary | ICD-10-CM

## 2020-01-20 DIAGNOSIS — L719 Rosacea, unspecified: Secondary | ICD-10-CM | POA: Diagnosis not present

## 2020-01-20 DIAGNOSIS — Z1283 Encounter for screening for malignant neoplasm of skin: Secondary | ICD-10-CM

## 2020-01-20 DIAGNOSIS — D229 Melanocytic nevi, unspecified: Secondary | ICD-10-CM | POA: Diagnosis not present

## 2020-01-20 DIAGNOSIS — L578 Other skin changes due to chronic exposure to nonionizing radiation: Secondary | ICD-10-CM

## 2020-01-20 DIAGNOSIS — D485 Neoplasm of uncertain behavior of skin: Secondary | ICD-10-CM | POA: Diagnosis not present

## 2020-01-20 DIAGNOSIS — D1800 Hemangioma unspecified site: Secondary | ICD-10-CM

## 2020-01-20 NOTE — Progress Notes (Signed)
Follow-Up Visit   Subjective  Tabitha Tucker is a 59 y.o. female who presents for the following: Annual Exam (yearly TBSE, pt concerned about some moles on her face). The patient presents for Total-Body Skin Exam (TBSE) for skin cancer screening and mole check.  The following portions of the chart were reviewed this encounter and updated as appropriate:  Tobacco  Allergies  Meds  Problems  Med Hx  Surg Hx  Fam Hx     Review of Systems:  No other skin or systemic complaints except as noted in HPI or Assessment and Plan.  Objective  Well appearing patient in no apparent distress; mood and affect are within normal limits.  A full examination was performed including scalp, head, eyes, ears, nose, lips, neck, chest, axillae, abdomen, back, buttocks, bilateral upper extremities, bilateral lower extremities, hands, feet, fingers, toes, fingernails, and toenails. All findings within normal limits unless otherwise noted below.  Objective  R lateral forehead: 0.8 cm flesh papule   Objective  R oral commissure: 0.7 cm flesh papule   Objective  Head - Anterior (Face): Mid face erythema with telangiectasias +/- scattered inflammatory papules.   Objective  L forehead: Erythematous keratotic or waxy stuck-on papule or plaque.    Assessment & Plan    Neoplasm of skin (2) R lateral forehead  Epidermal / dermal shaving  Lesion diameter (cm):  0.8 Informed consent: discussed and consent obtained   Timeout: patient name, date of birth, surgical site, and procedure verified   Procedure prep:  Patient was prepped and draped in usual sterile fashion Prep type:  Isopropyl alcohol Anesthesia: the lesion was anesthetized in a standard fashion   Anesthetic:  1% lidocaine w/ epinephrine 1-100,000 buffered w/ 8.4% NaHCO3 Hemostasis achieved with: pressure, aluminum chloride and electrodesiccation   Outcome: patient tolerated procedure well   Post-procedure details: sterile dressing  applied and wound care instructions given   Dressing type: bandage and petrolatum    Specimen 1 - Surgical pathology Differential Diagnosis: Irritated nevus R/O Dysplastic nevus  Check Margins: No 0.5 cm flesh papule  R oral commissure  Epidermal / dermal shaving  Lesion diameter (cm):  0.7 Informed consent: discussed and consent obtained   Timeout: patient name, date of birth, surgical site, and procedure verified   Procedure prep:  Patient was prepped and draped in usual sterile fashion Prep type:  Isopropyl alcohol Anesthesia: the lesion was anesthetized in a standard fashion   Anesthetic:  1% lidocaine w/ epinephrine 1-100,000 buffered w/ 8.4% NaHCO3 Hemostasis achieved with: pressure, aluminum chloride and electrodesiccation   Outcome: patient tolerated procedure well   Post-procedure details: sterile dressing applied and wound care instructions given   Dressing type: bandage and petrolatum   Additional details:     Specimen 2 - Surgical pathology Differential Diagnosis: Irritated nevus R/O Dysplastic nevus Check Margins: No 0.4 cm flesh papule  Irritated nevus R/O Dysplastic nevus   Rosacea Head - Anterior (Face)  Discussed laser/BBL cosmetic treatment would help   No treatment needed   Inflamed seborrheic keratosis L forehead  Destruction of lesion - L forehead Complexity: simple   Destruction method: cryotherapy   Informed consent: discussed and consent obtained   Timeout:  patient name, date of birth, surgical site, and procedure verified Lesion destroyed using liquid nitrogen: Yes   Region frozen until ice ball extended beyond lesion: Yes   Outcome: patient tolerated procedure well with no complications   Post-procedure details: wound care instructions given    Skin  cancer screening   Lentigines - Scattered tan macules - Discussed due to sun exposure - Benign, observe - Call for any changes  Seborrheic Keratoses - Stuck-on, waxy, tan-brown  papules and plaques  - Discussed benign etiology and prognosis. - Observe - Call for any changes  Melanocytic Nevi - Tan-brown and/or pink-flesh-colored symmetric macules and papules - Benign appearing on exam today - Observation - Call clinic for new or changing moles - Recommend daily use of broad spectrum spf 30+ sunscreen to sun-exposed areas.   Hemangiomas - Red papules - Discussed benign nature - Observe - Call for any changes  Actinic Damage - diffuse scaly erythematous macules with underlying dyspigmentation - Recommend daily broad spectrum sunscreen SPF 30+ to sun-exposed areas, reapply every 2 hours as needed.  - Call for new or changing lesions.  Skin cancer screening performed today.  Telangiectasia - Dilated blood vessel - Benign appearing on exam - Call for changes  Hemangiomas - Red papules - Discussed benign nature - Observe - Call for any changes  Return in about 1 year (around 01/19/2021) for TBSE.  IMarye Round, CMA, am acting as scribe for Sarina Ser, MD .  Documentation: I have reviewed the above documentation for accuracy and completeness, and I agree with the above.  Sarina Ser, MD

## 2020-01-20 NOTE — Patient Instructions (Signed)

## 2020-01-21 ENCOUNTER — Telehealth: Payer: Self-pay

## 2020-01-21 NOTE — Telephone Encounter (Signed)
Patient called and informed of biopsy results, patient verbalized understanding.  

## 2020-01-24 ENCOUNTER — Encounter: Payer: Self-pay | Admitting: Dermatology

## 2020-03-26 IMAGING — MG DIGITAL DIAGNOSTIC BILAT W/ TOMO W/ CAD
6 of 9 series · 6 of 25 positions shown · non-contrast
Comparison: Previous exam(s).

CLINICAL DATA: History of LEFT breast cancer diagnosed in Tuesday July, 2018, now status post breast conservation surgery.

EXAM:
DIGITAL DIAGNOSTIC BILATERAL MAMMOGRAM WITH CAD AND TOMO

[L CC]
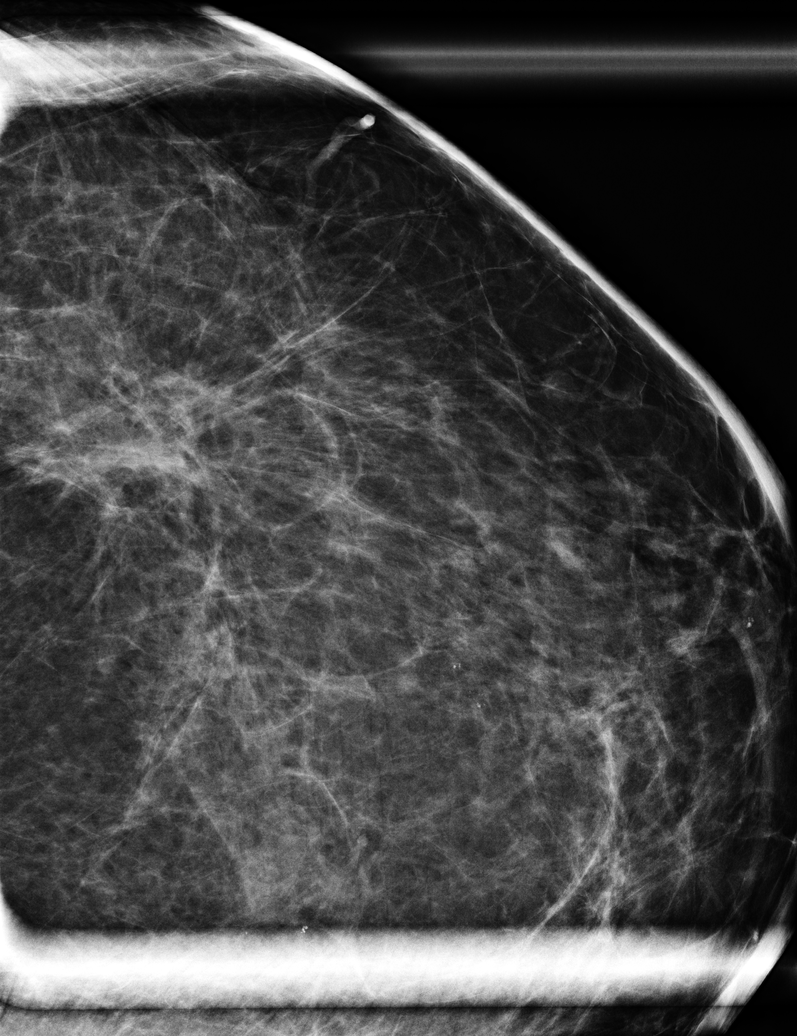

[R MLO synth-2D]
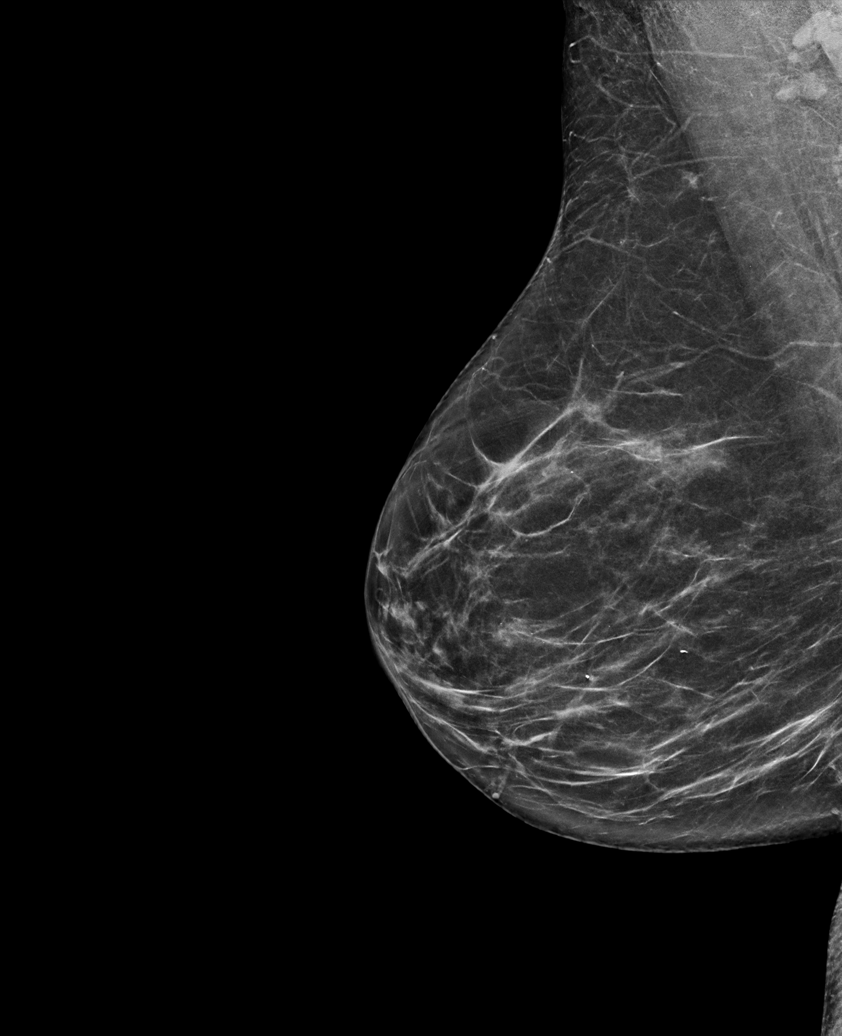

[L MLO synth-2D]
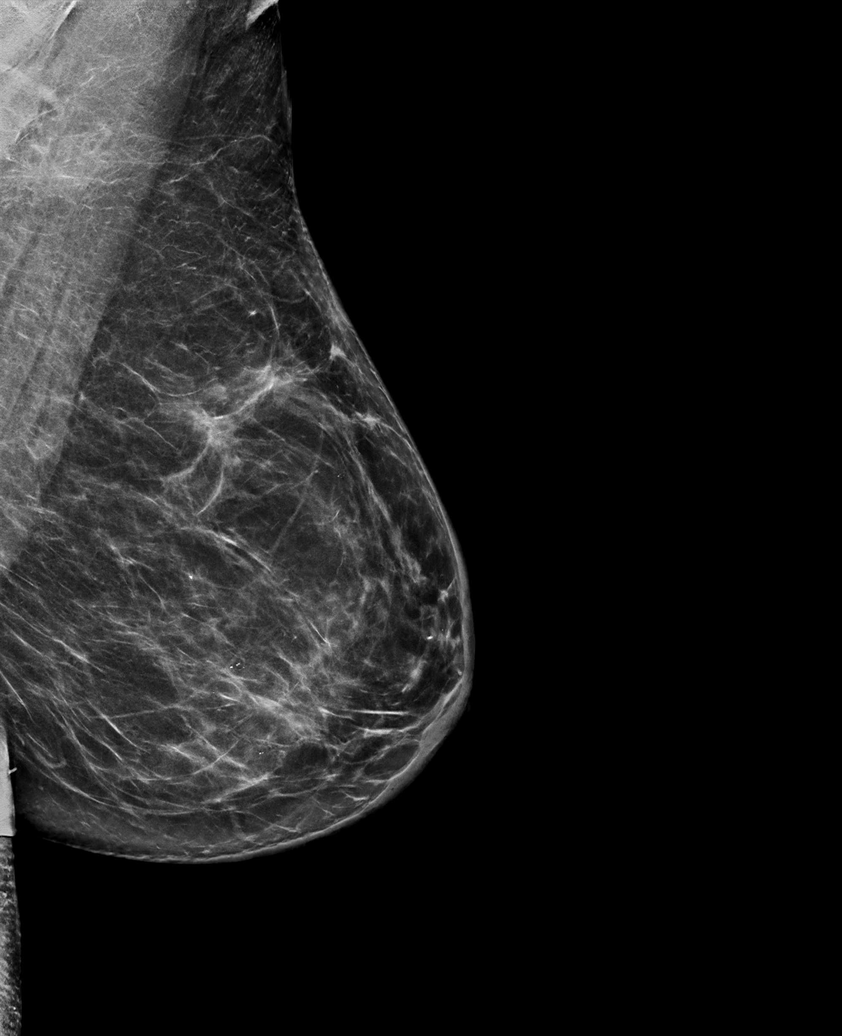

[L CC synth-2D]
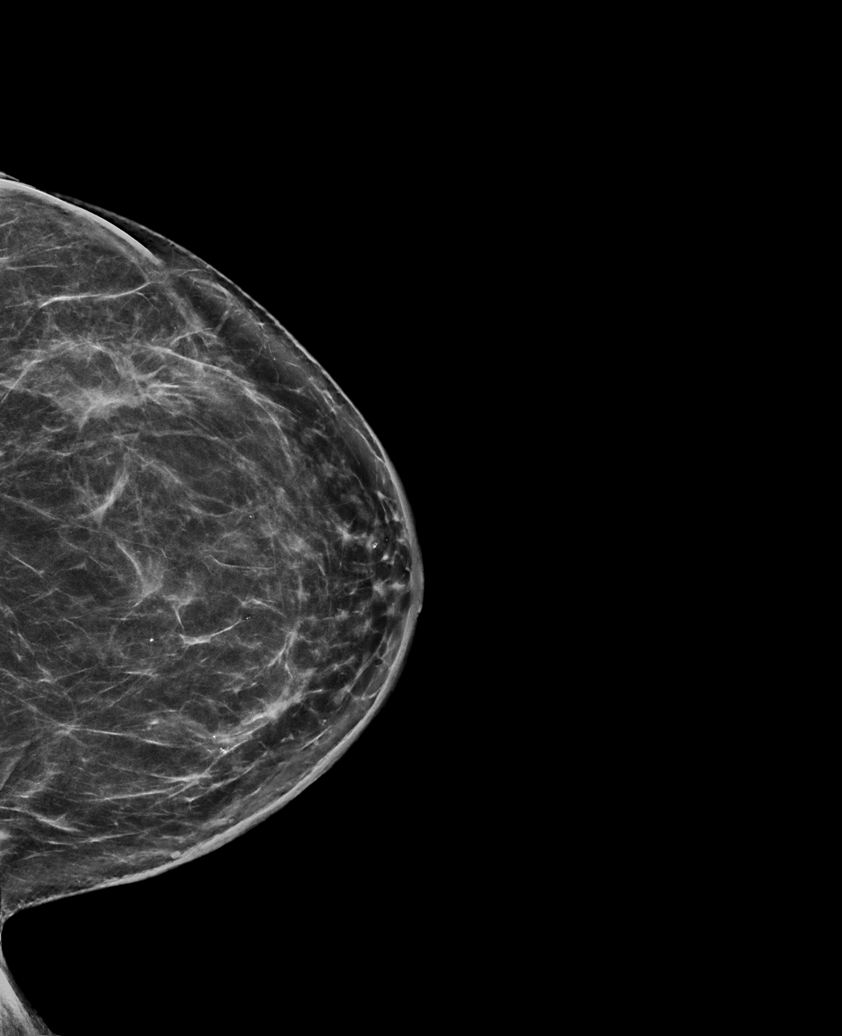

[R CC synth-2D]
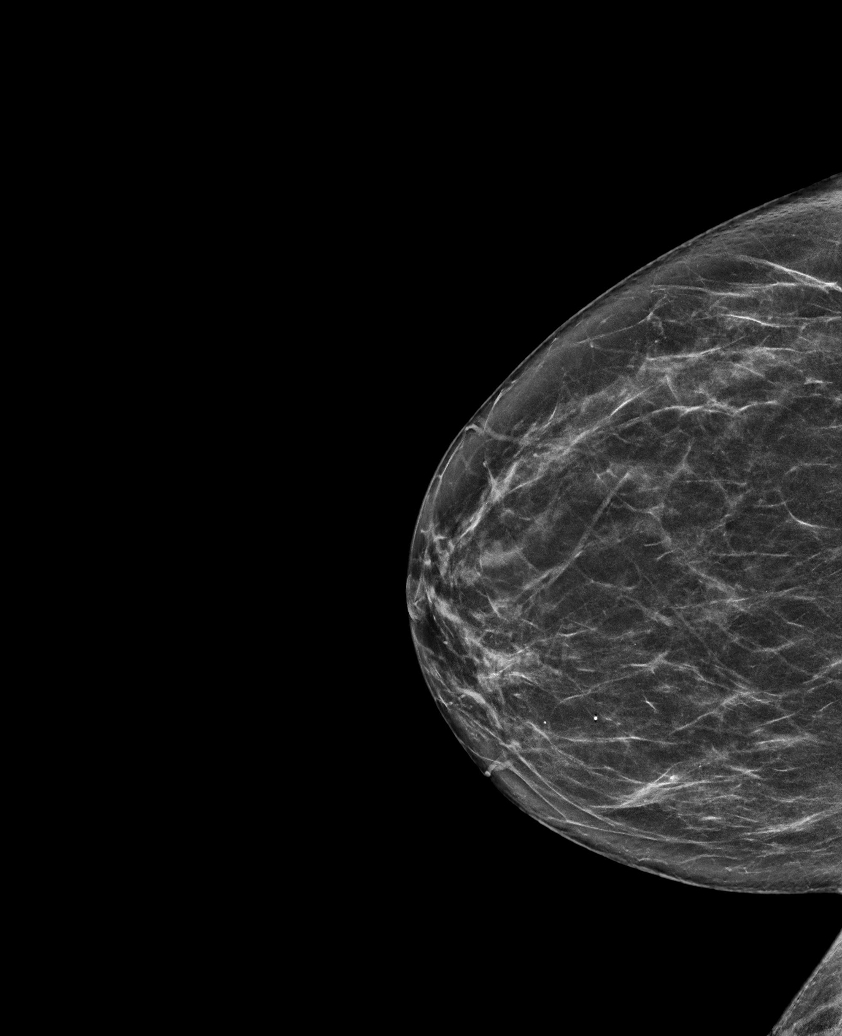

[L CC tomo · tomo slice 39/78.0]
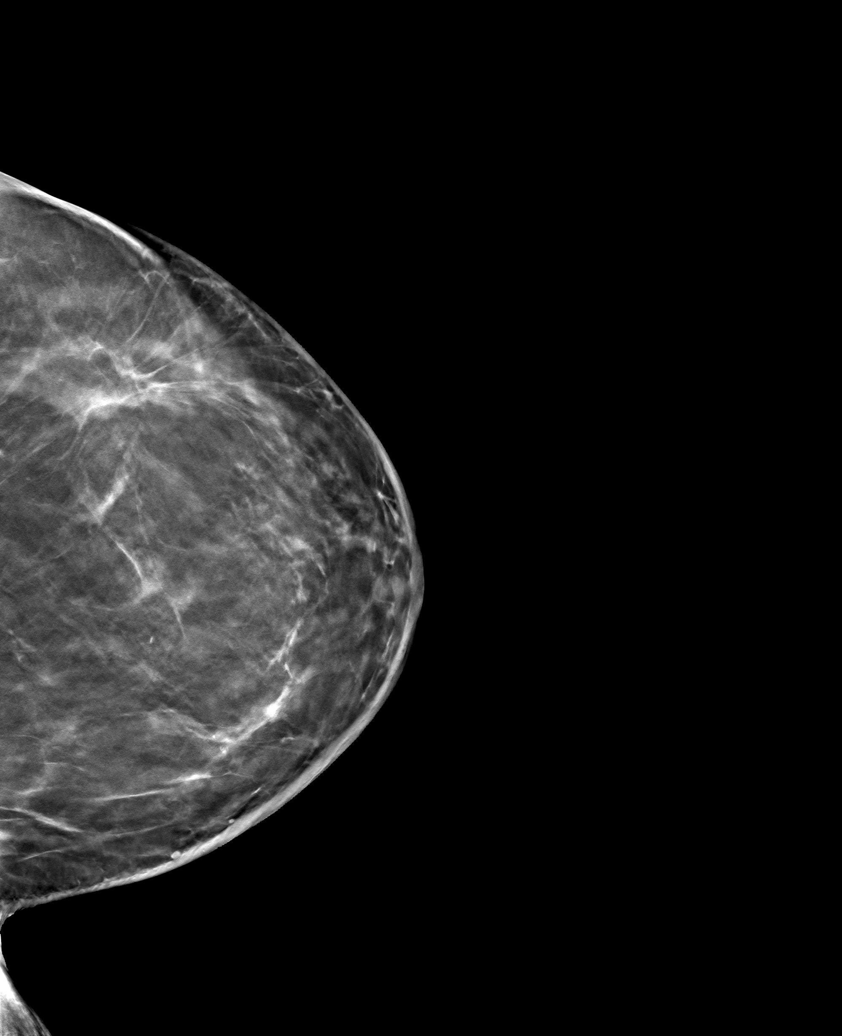

[6 of 25 positions shown; findings below may reference images not displayed]

ACR Breast Density Category b: There are scattered areas of
fibroglandular density.
FINDINGS: There are expected postsurgical changes within the upper outer
quadrant of the LEFT breast. There are no new dominant masses,
suspicious calcifications or secondary signs of malignancy within
either breast.

Mammographic images were processed with CAD.
IMPRESSION: No evidence of malignancy within either breast. Expected
postsurgical changes within the LEFT breast.

RECOMMENDATION:
Bilateral diagnostic mammogram in 1 year.

I have discussed the findings and recommendations with the patient.
If applicable, a reminder letter will be sent to the patient
regarding the next appointment.

BI-RADS CATEGORY  2: Benign.

## 2020-04-13 ENCOUNTER — Other Ambulatory Visit: Payer: Self-pay | Admitting: *Deleted

## 2020-04-13 MED ORDER — ANASTROZOLE 1 MG PO TABS: 1.0000 mg | ORAL_TABLET | Freq: Every day | ORAL | 0 refills | Status: DC

## 2020-06-01 ENCOUNTER — Other Ambulatory Visit: Payer: Self-pay | Admitting: Obstetrics & Gynecology

## 2020-06-03 ENCOUNTER — Other Ambulatory Visit: Payer: Self-pay | Admitting: Obstetrics & Gynecology

## 2020-06-03 DIAGNOSIS — C50911 Malignant neoplasm of unspecified site of right female breast: Secondary | ICD-10-CM

## 2020-06-07 ENCOUNTER — Ambulatory Visit: Payer: BC Managed Care – PPO | Admitting: Oncology

## 2020-06-07 ENCOUNTER — Telehealth: Payer: BC Managed Care – PPO | Admitting: Oncology

## 2020-06-07 ENCOUNTER — Other Ambulatory Visit: Payer: BC Managed Care – PPO

## 2020-06-14 ENCOUNTER — Inpatient Hospital Stay: Payer: BC Managed Care – PPO | Attending: Oncology

## 2020-06-14 ENCOUNTER — Other Ambulatory Visit: Payer: Self-pay | Admitting: *Deleted

## 2020-06-14 DIAGNOSIS — Z170 Estrogen receptor positive status [ER+]: Secondary | ICD-10-CM | POA: Diagnosis not present

## 2020-06-14 DIAGNOSIS — Z79811 Long term (current) use of aromatase inhibitors: Secondary | ICD-10-CM | POA: Diagnosis not present

## 2020-06-14 DIAGNOSIS — Z7289 Other problems related to lifestyle: Secondary | ICD-10-CM | POA: Diagnosis not present

## 2020-06-14 DIAGNOSIS — C50412 Malignant neoplasm of upper-outer quadrant of left female breast: Secondary | ICD-10-CM | POA: Insufficient documentation

## 2020-06-14 DIAGNOSIS — Z8249 Family history of ischemic heart disease and other diseases of the circulatory system: Secondary | ICD-10-CM | POA: Diagnosis not present

## 2020-06-14 DIAGNOSIS — Z79899 Other long term (current) drug therapy: Secondary | ICD-10-CM | POA: Diagnosis not present

## 2020-06-14 LAB — COMPREHENSIVE METABOLIC PANEL
ALT: 15 U/L (ref 0–44)
AST: 19 U/L (ref 15–41)
Albumin: 4.2 g/dL (ref 3.5–5.0)
Alkaline Phosphatase: 61 U/L (ref 38–126)
Anion gap: 10 (ref 5–15)
BUN: 15 mg/dL (ref 6–20)
CO2: 28 mmol/L (ref 22–32)
Calcium: 9.9 mg/dL (ref 8.9–10.3)
Chloride: 101 mmol/L (ref 98–111)
Creatinine, Ser: 0.86 mg/dL (ref 0.44–1.00)
GFR, Estimated: >60 mL/min (ref >60)
Glucose, Bld: 100 mg/dL — ABNORMAL HIGH (ref 70–99)
Potassium: 4.3 mmol/L (ref 3.5–5.1)
Sodium: 139 mmol/L (ref 135–145)
Total Bilirubin: 0.7 mg/dL (ref 0.3–1.2)
Total Protein: 7.3 g/dL (ref 6.5–8.1)

## 2020-06-14 NOTE — Progress Notes (Signed)
Met

## 2020-06-15 ENCOUNTER — Inpatient Hospital Stay (HOSPITAL_BASED_OUTPATIENT_CLINIC_OR_DEPARTMENT_OTHER): Payer: BC Managed Care – PPO | Admitting: Oncology

## 2020-06-15 DIAGNOSIS — Z08 Encounter for follow-up examination after completed treatment for malignant neoplasm: Secondary | ICD-10-CM | POA: Diagnosis not present

## 2020-06-15 DIAGNOSIS — Z853 Personal history of malignant neoplasm of breast: Secondary | ICD-10-CM | POA: Diagnosis not present

## 2020-06-21 ENCOUNTER — Encounter: Payer: Self-pay | Admitting: Oncology

## 2020-06-21 NOTE — Progress Notes (Signed)
I connected with Tabitha Tucker on 06/21/20 at  2:30 PM EST by video enabled telemedicine visit and verified that I am speaking with the correct person using two identifiers.   I discussed the limitations, risks, security and privacy concerns of performing an evaluation and management service by telemedicine and the availability of in-person appointments. I also discussed with the patient that there may be a patient responsible charge related to this service. The patient expressed understanding and agreed to proceed.  There were problems during connection with video visit and had to be switched to a telephone call  Other persons participating in the visit and their role in the encounter:  none  Patient's location:  home Provider's location:  work  Risk analyst Complaint: Routine follow-up of breast cancer and history of easy bruising  History of present illness: patient is a 59 year old Caucasian female who recently underwent a screening mammogram which showed 1.7 x 1.2 x 1.6 cm hypoechoic irregular area in her left breast at 1 o'clock position. No evidence of left axillary adenopathy. This was 5 biopsied and was invasive mammary carcinoma grade 2, ER greater than 90% positive PR 11 to 50% positive and HER-2/neu negative. Patient will be seeing surgery tomorrow. She has never had prior breast biopsies. She is G2 P2 L2. She has had a hysterectomy many years ago. No significant family history of breast cancer, ovarian cancer, colon gastric or pancreatic cancer.  Patient underwent lumpectomy with sentinel lymph node biopsy on 07/25/2018. Final pathology showed invasive mammary carcinoma 1.7 cm, grade 2 with negative margins. 2 out of 2 sentinel lymph nodes were positive for malignancy. One lymph node was involved by micrometastatic carcinoma 1.5 mm and the other lymph node was involved with macro metastatic disease 5 mm. There was focal microscopy extracapsular extension present which I verified with Dr.  Reuel Derby from pathology. pT1c pN1A. Systemic CT scans denied by insurance.She completed adjuvant radiation treatment  Patient underwent MammaPrint testing which came back as low risk with no significant benefit from adjuvant chemotherapy.Patient started taking Arimidex in May 2020.   Interval history: Patient reports doing well and tolerating Arimidex along with calcium and vitamin D without any significant side effects.  Reports that her bruising episodes have resolved and she has not had one in the last 4 to 6 weeks.   Review of Systems  Constitutional: Negative for chills, fever, malaise/fatigue and weight loss.  HENT: Negative for congestion, ear discharge and nosebleeds.   Eyes: Negative for blurred vision.  Respiratory: Negative for cough, hemoptysis, sputum production, shortness of breath and wheezing.   Cardiovascular: Negative for chest pain, palpitations, orthopnea and claudication.  Gastrointestinal: Negative for abdominal pain, blood in stool, constipation, diarrhea, heartburn, melena, nausea and vomiting.  Genitourinary: Negative for dysuria, flank pain, frequency, hematuria and urgency.  Musculoskeletal: Negative for back pain, joint pain and myalgias.  Skin: Negative for rash.  Neurological: Negative for dizziness, tingling, focal weakness, seizures, weakness and headaches.  Endo/Heme/Allergies: Does not bruise/bleed easily.  Psychiatric/Behavioral: Negative for depression and suicidal ideas. The patient does not have insomnia.     No Known Allergies  Past Medical History:  Diagnosis Date   Breast cancer ([place]) 07/2018   High cholesterol    Panic attacks    Personal history of radiation therapy    PONV (postoperative nausea and vomiting)     Past Surgical History:  Procedure Laterality Date   ABDOMINAL HYSTERECTOMY     pt did keep ovaries   APPENDECTOMY  ate age 78   BREAST BIOPSY Left 07/10/2018   INVASIVE MAMMARY CARCINOMA   BREAST  LUMPECTOMY Left 07/25/2018   [facility],    BREAST LUMPECTOMY WITH NEEDLE LOCALIZATION AND AXILLARY SENTINEL LYMPH NODE BX Left 07/25/2018   Procedure: BREAST LUMPECTOMY WITH NEEDLE LOCALIZATION AND SENTINEL NODE BX;  Surgeon: Benjamine Sprague, DO;  Location: [facility] ORS;  Service: General;  Laterality: Left;    Social History   Socioeconomic History   Marital status: Married    Spouse name: Not on file   Number of children: Not on file   Years of education: Not on file   Highest education level: Not on file  Occupational History   Not on file  Tobacco Use   Smoking status: Never Smoker   Smokeless tobacco: Never Used  Vaping Use   Vaping Use: Never used  Substance and Sexual Activity   Alcohol use: Yes    Alcohol/week: 14.0 standard drinks    Types: 14 Glasses of wine per week    Comment: 2 glasses of wine every day   Drug use: Not Currently    Types: Marijuana    Comment: marijuana when she was a teenager a few time   Sexual activity: Yes  Other Topics Concern   Not on file  Social History Narrative   Not on file   Social Determinants of Health   Financial Resource Strain: Not on file  Food Insecurity: Not on file  Transportation Needs: Not on file  Physical Activity: Not on file  Stress: Not on file  Social Connections: Not on file  Intimate Partner Violence: Not on file    Family History  Problem Relation Age of Onset   Hypertension Father    Breast cancer Neg Hx      Current Outpatient Medications:    ALPRAZolam (XANAX) 0.25 MG tablet, Take 0.25 mg by mouth 2 (two) times daily as needed for anxiety., Disp: , Rfl:    anastrozole (ARIMIDEX) 1 MG tablet, Take 1 tablet (1 mg total) by mouth daily., Disp: 90 tablet, Rfl: 0   B Complex-C (SUPER B COMPLEX PO), Take 1 tablet by mouth daily., Disp: , Rfl:    Calcium Carbonate-Vit D-Min (CALCIUM 600+D PLUS MINERALS) 600-400 MG-UNIT TABS, Take by mouth., Disp: , Rfl:    Calcium Carbonate-Vitamin D3 (CALCIUM  600-D) 600-400 MG-UNIT TABS, Take 1 tablet by mouth 2 (two) times a day., Disp: , Rfl:    Garlic 9767 MG CAPS, Take 1,000 mg by mouth daily., Disp: , Rfl:    simvastatin (ZOCOR) 10 MG tablet, Take 10 mg by mouth daily., Disp: , Rfl:   No results found.  No images are attached to the encounter.   CMP Latest Ref Rng & Units 06/14/2020  Glucose 70 - 99 mg/dL 100(H)  BUN 6 - 20 mg/dL 15  Creatinine 0.44 - 1.00 mg/dL 0.86  Sodium 135 - 145 mmol/L 139  Potassium 3.5 - 5.1 mmol/L 4.3  Chloride 98 - 111 mmol/L 101  CO2 22 - 32 mmol/L 28  Calcium 8.9 - 10.3 mg/dL 9.9  Total Protein 6.5 - 8.1 g/dL 7.3  Total Bilirubin 0.3 - 1.2 mg/dL 0.7  Alkaline Phos 38 - 126 U/L 61  AST 15 - 41 U/L 19  ALT 0 - 44 U/L 15   CBC Latest Ref Rng & Units 12/08/2019  WBC 4.0 - 10.5 K/uL 5.2  Hemoglobin 12.0 - 15.0 g/dL 13.9  Hematocrit 36.0 - 46.0 % 40.9  Platelets 150 - 400  K/uL 270    Assessment and plan: Patient is a 59 year old female with pathological prognostic stage Ia invasive mammary carcinoma of the left breast ER/PR positive HER-2 negative s/p lumpectomy adjuvant radiation therapy and currently on Arimidex.  This is a routine follow-up visit  Patient is tolerating her Arimidex well without any significant side effects.  She will continue to take that along with calcium and vitamin D.  I have seen her also for symptoms of easy bruising and all the pertinent lab work was otherwise negative.  I will see her back in 6 months in person and she would need a bone density scan prior. She already has a mammogram scheduled for 06/29/2020. Follow-up instructions: As above  I discussed the assessment and treatment plan with the patient. The patient was provided an opportunity to ask questions and all were answered. The patient agreed with the plan and demonstrated an understanding of the instructions.   The patient was advised to call back or seek an in-person evaluation if the symptoms worsen or if the  condition fails to improve as anticipated.   Visit Diagnosis: 1. Encounter for follow-up surveillance of breast cancer     Dr. Randa Evens, MD, MPH [facility] Tel- [phone] 06/21/2020 9:12 AM

## 2020-06-29 ENCOUNTER — Ambulatory Visit
Admission: RE | Admit: 2020-06-29 | Discharge: 2020-06-29 | Disposition: A | Discharge status: Home / Self Care | Payer: BC Managed Care – PPO | Source: Ambulatory Visit | Attending: Obstetrics & Gynecology | Admitting: Obstetrics & Gynecology

## 2020-06-29 ENCOUNTER — Other Ambulatory Visit: Payer: Self-pay

## 2020-06-29 DIAGNOSIS — C773 Secondary and unspecified malignant neoplasm of axilla and upper limb lymph nodes: Secondary | ICD-10-CM | POA: Diagnosis present

## 2020-06-29 DIAGNOSIS — C50911 Malignant neoplasm of unspecified site of right female breast: Secondary | ICD-10-CM

## 2020-07-14 ENCOUNTER — Other Ambulatory Visit: Payer: Self-pay | Admitting: Oncology

## 2020-11-17 ENCOUNTER — Encounter: Payer: Self-pay | Admitting: Radiation Oncology

## 2020-11-17 ENCOUNTER — Ambulatory Visit
Admission: RE | Admit: 2020-11-17 | Discharge: 2020-11-17 | Disposition: A | Discharge status: Home / Self Care | Payer: BC Managed Care – PPO | Source: Ambulatory Visit | Attending: Radiation Oncology | Admitting: Radiation Oncology

## 2020-11-17 ENCOUNTER — Other Ambulatory Visit: Payer: Self-pay

## 2020-11-17 VITALS — BP 125/81 | HR 75 | Temp 97.4°F | Resp 16 | Wt 143.2 lb

## 2020-11-17 DIAGNOSIS — Z170 Estrogen receptor positive status [ER+]: Secondary | ICD-10-CM | POA: Insufficient documentation

## 2020-11-17 DIAGNOSIS — C50412 Malignant neoplasm of upper-outer quadrant of left female breast: Secondary | ICD-10-CM

## 2020-11-17 DIAGNOSIS — Z923 Personal history of irradiation: Secondary | ICD-10-CM | POA: Insufficient documentation

## 2020-11-17 DIAGNOSIS — Z79811 Long term (current) use of aromatase inhibitors: Secondary | ICD-10-CM | POA: Insufficient documentation

## 2020-11-17 NOTE — Progress Notes (Signed)
Radiation Oncology Follow up Note  Name: Tabitha Tucker   Date:   11/17/2020 MRN:  [id number] DOB: 07-18-1960    This 60 y.o. female presents to the clinic today for 2-year follow-up status post whole breast radiation to her left breast for stage I ER/PR positive invasive mammary carcinoma.  REFERRING PROVIDER: Ward, Honor Loh, MD  HPI: Patient is a 60 year old female now out 2 years having pleated whole breast radiation to her left breast for stage I ER/PR positive invasive mammary carcinoma.  Seen today in routine follow-up she is doing well.  She specifically denies breast tenderness cough or bone pain.  She is currently on.  Arimidex tolerating it well without side effect.  She had mammograms back in December which I have reviewed were BI-RADS 2 benign.  She has been having some slight tenderness in her left axilla which I assured her is secondary to scar tissue and may persist.  COMPLICATIONS OF TREATMENT: none  FOLLOW UP COMPLIANCE: keeps appointments   PHYSICAL EXAM:  BP 125/81   Pulse 75   Temp (!) 97.4 F (36.3 C) (Tympanic)   Resp 16   Wt 143 lb 3.2 oz (65 kg)   BMI 24.58 kg/m  Lungs are clear to A&P cardiac examination essentially unremarkable with regular rate and rhythm. No dominant mass or nodularity is noted in either breast in 2 positions examined. Incision is well-healed. No axillary or supraclavicular adenopathy is appreciated. Cosmetic result is excellent.  Well-developed well-nourished patient in NAD. HEENT reveals PERLA, EOMI, discs not visualized.  Oral cavity is clear. No oral mucosal lesions are identified. Neck is clear without evidence of cervical or supraclavicular adenopathy. Lungs are clear to A&P. Cardiac examination is essentially unremarkable with regular rate and rhythm without murmur rub or thrill. Abdomen is benign with no organomegaly or masses noted. Motor sensory and DTR levels are equal and symmetric in the upper and lower extremities. Cranial nerves II  through XII are grossly intact. Proprioception is intact. No peripheral adenopathy or edema is identified. No motor or sensory levels are noted. Crude visual fields are within normal range.  RADIOLOGY RESULTS: Mammograms reviewed compatible with above-stated findings  PLAN: Present time patient is now 2 years out from whole breast radiation and pleased with her overall progress.  She continues to do well with no evidence of disease.  She continues on Arimidex without side effect.  I have asked to see her back in 1 year for follow-up.  Patient knows to call with any concerns.  I would like to take this opportunity to thank you for allowing me to participate in the care of your patient.Noreene Filbert, MD

## 2020-12-14 ENCOUNTER — Other Ambulatory Visit: Payer: Self-pay

## 2020-12-14 ENCOUNTER — Encounter: Payer: Self-pay | Admitting: Oncology

## 2020-12-14 ENCOUNTER — Inpatient Hospital Stay: Payer: BC Managed Care – PPO | Attending: Oncology | Admitting: Oncology

## 2020-12-14 ENCOUNTER — Other Ambulatory Visit: Payer: Self-pay | Admitting: *Deleted

## 2020-12-14 DIAGNOSIS — Z853 Personal history of malignant neoplasm of breast: Secondary | ICD-10-CM

## 2020-12-14 DIAGNOSIS — Z5181 Encounter for therapeutic drug level monitoring: Secondary | ICD-10-CM

## 2020-12-14 DIAGNOSIS — Z923 Personal history of irradiation: Secondary | ICD-10-CM | POA: Insufficient documentation

## 2020-12-14 DIAGNOSIS — Z170 Estrogen receptor positive status [ER+]: Secondary | ICD-10-CM | POA: Diagnosis not present

## 2020-12-14 DIAGNOSIS — Z08 Encounter for follow-up examination after completed treatment for malignant neoplasm: Secondary | ICD-10-CM | POA: Diagnosis not present

## 2020-12-14 DIAGNOSIS — Z79811 Long term (current) use of aromatase inhibitors: Secondary | ICD-10-CM

## 2020-12-14 DIAGNOSIS — C50412 Malignant neoplasm of upper-outer quadrant of left female breast: Secondary | ICD-10-CM

## 2020-12-14 MED ORDER — ANASTROZOLE 1 MG PO TABS: 1.0000 mg | ORAL_TABLET | Freq: Every day | ORAL | 2 refills | Status: DC

## 2020-12-14 NOTE — Progress Notes (Signed)
Pt states that she has like hard feeling in her breast and axilla an it feels full for 1-2 days and goes away. She has had the right breast do the same thing a couple of times.her breast cancer and lumpectomy was left

## 2020-12-14 NOTE — Progress Notes (Signed)
Hematology/Oncology Consult note [facility]  Telephone:([phone] Fax:([phone]) [phone]  Patient Care Team: Ward, Elenora Fender, MD as PCP - General (Obstetrics and Gynecology) Creig Hines, MD as Consulting Physician (Hematology and Oncology)   Name of the patient: Tabitha Tucker  [id number]  01/22/1961   Date of visit: 12/14/20  Diagnosis-stage I ER positive breast cancer on Arimidex  Chief complaint/ Reason for visit-routine follow-up of breast cancer on Arimidex  Heme/Onc history: patient is a 60 year old Caucasian female who recently underwent a screening mammogram which showed 1.7 x 1.2 x 1.6 cm hypoechoic irregular area in her left breast at 1 o'clock position.  No evidence of left axillary adenopathy.  This was 5 biopsied and was invasive mammary carcinoma grade 2, ER greater than 90% positive PR 11 to 50% positive and HER-2/neu negative.  Patient will be seeing surgery tomorrow.  She has never had prior breast biopsies.  She is G2 P2 L2.  She has had a hysterectomy many years ago.  No significant family history of breast cancer, ovarian cancer, colon gastric or pancreatic cancer.    Patient underwent lumpectomy with sentinel lymph node biopsy on 07/25/2018.  Final pathology showed invasive mammary carcinoma 1.7 cm, grade 2 with negative margins.  2 out of 2 sentinel lymph nodes were positive for malignancy.  One lymph node was involved by micrometastatic carcinoma 1.5 mm and the other lymph node was involved with macro metastatic disease 5 mm.  There was focal microscopy extracapsular extension present which I verified with Dr. Oneita Kras from pathology.  pT1c pN1A.  Systemic CT scans denied by insurance.  She completed adjuvant radiation treatment   Patient underwent MammaPrint testing which came back as low risk with no significant benefit from adjuvant chemotherapy.  Patient started taking Arimidex in May 2020.    Interval history-patient reports doing well and  denies any complaints at this time.  Tolerating Arimidex well without any significant side effects.  ECOG PS- 0 Pain scale- 0   Review of systems- Review of Systems  Constitutional:  Negative for chills, fever, malaise/fatigue and weight loss.  HENT:  Negative for congestion, ear discharge and nosebleeds.   Eyes:  Negative for blurred vision.  Respiratory:  Negative for cough, hemoptysis, sputum production, shortness of breath and wheezing.   Cardiovascular:  Negative for chest pain, palpitations, orthopnea and claudication.  Gastrointestinal:  Negative for abdominal pain, blood in stool, constipation, diarrhea, heartburn, melena, nausea and vomiting.  Genitourinary:  Negative for dysuria, flank pain, frequency, hematuria and urgency.  Musculoskeletal:  Negative for back pain, joint pain and myalgias.  Skin:  Negative for rash.  Neurological:  Negative for dizziness, tingling, focal weakness, seizures, weakness and headaches.  Endo/Heme/Allergies:  Does not bruise/bleed easily.  Psychiatric/Behavioral:  Negative for depression and suicidal ideas. The patient does not have insomnia.       No Known Allergies   Past Medical History:  Diagnosis Date   Breast cancer (HCC) 07/2018   High cholesterol    Panic attacks    Personal history of radiation therapy    PONV (postoperative nausea and vomiting)      Past Surgical History:  Procedure Laterality Date   ABDOMINAL HYSTERECTOMY     pt did keep ovaries   APPENDECTOMY     ate age 66   BREAST BIOPSY Left 07/10/2018   INVASIVE MAMMARY CARCINOMA   BREAST LUMPECTOMY Left 07/25/2018   [facility],    BREAST LUMPECTOMY WITH NEEDLE LOCALIZATION AND AXILLARY  SENTINEL LYMPH NODE BX Left 07/25/2018   Procedure: BREAST LUMPECTOMY WITH NEEDLE LOCALIZATION AND SENTINEL NODE BX;  Surgeon: Benjamine Sprague, DO;  Location: [facility] ORS;  Service: General;  Laterality: Left;    Social History   Socioeconomic History   Marital status: Married    Spouse  name: Not on file   Number of children: Not on file   Years of education: Not on file   Highest education level: Not on file  Occupational History   Not on file  Tobacco Use   Smoking status: Never   Smokeless tobacco: Never  Vaping Use   Vaping Use: Never used  Substance and Sexual Activity   Alcohol use: Yes    Alcohol/week: 14.0 standard drinks    Types: 14 Glasses of wine per week    Comment: 2 glasses of wine every day   Drug use: Not Currently    Types: Marijuana    Comment: marijuana when she was a teenager a few time   Sexual activity: Yes  Other Topics Concern   Not on file  Social History Narrative   Not on file   Social Determinants of Health   Financial Resource Strain: Not on file  Food Insecurity: Not on file  Transportation Needs: Not on file  Physical Activity: Not on file  Stress: Not on file  Social Connections: Not on file  Intimate Partner Violence: Not on file    Family History  Problem Relation Age of Onset   Hypertension Father    Breast cancer Neg Hx      Current Outpatient Medications:    ALPRAZolam (XANAX) 0.25 MG tablet, Take 0.25 mg by mouth 2 (two) times daily as needed for anxiety., Disp: , Rfl:    B Complex-C (SUPER B COMPLEX PO), Take 1 tablet by mouth daily., Disp: , Rfl:    anastrozole (ARIMIDEX) 1 MG tablet, Take 1 tablet (1 mg total) by mouth daily., Disp: 90 tablet, Rfl: 2   Calcium Carbonate-Vit D-Min (CALCIUM 600+D PLUS MINERALS) 600-400 MG-UNIT TABS, Take 2 tablets by mouth daily., Disp: , Rfl:    Garlic 2426 MG CAPS, Take 1,000 mg by mouth daily., Disp: , Rfl:    simvastatin (ZOCOR) 10 MG tablet, Take 10 mg by mouth daily., Disp: , Rfl:   Physical exam:  Physical Exam Cardiovascular:     Rate and Rhythm: Normal rate and regular rhythm.     Heart sounds: Normal heart sounds.  Pulmonary:     Effort: Pulmonary effort is normal.     Breath sounds: Normal breath sounds.  Skin:    General: Skin is warm and dry.   Neurological:     Mental Status: She is alert and oriented to person, place, and time.    Breast exam was performed in seated and lying down position. Patient is status post left lumpectomy with a well-healed surgical scar. No evidence of any palpable masses. No evidence of axillary adenopathy. No evidence of any palpable masses or lumps in the right breast. No evidence of right axillary adenopathy   CMP Latest Ref Rng & Units 06/14/2020  Glucose 70 - 99 mg/dL 100(H)  BUN 6 - 20 mg/dL 15  Creatinine 0.44 - 1.00 mg/dL 0.86  Sodium 135 - 145 mmol/L 139  Potassium 3.5 - 5.1 mmol/L 4.3  Chloride 98 - 111 mmol/L 101  CO2 22 - 32 mmol/L 28  Calcium 8.9 - 10.3 mg/dL 9.9  Total Protein 6.5 - 8.1 g/dL 7.3  Total Bilirubin  0.3 - 1.2 mg/dL 0.7  Alkaline Phos 38 - 126 U/L 61  AST 15 - 41 U/L 19  ALT 0 - 44 U/L 15   CBC Latest Ref Rng & Units 12/08/2019  WBC 4.0 - 10.5 K/uL 5.2  Hemoglobin 12.0 - 15.0 g/dL 13.9  Hematocrit 36.0 - 46.0 % 40.9  Platelets 150 - 400 K/uL 270     Assessment and plan- Patient is a 60 y.o. female with pathological prognostic stage Ia invasive mammary carcinoma of the left breast ER/PR positive HER-2 negative s/p lumpectomy adjuvant radiation therapy and currently on Arimidex.This is a routine f/u visit for breast cancer  Clinically patient is doing well with no concerning signs and symptoms of recurrence based on today's exam.  She is tolerating Arimidex along with calcium and vitamin D well without any significant side effects.  She will see covering NP in 6 months and I will see her back in a year.  We will also schedule her mammogram in December 2022.    Visit Diagnosis 1. Encounter for follow-up surveillance of breast cancer   2. Malignant neoplasm of upper-outer quadrant of left breast in female, estrogen receptor positive ([place])   3. Visit for monitoring Arimidex therapy      Dr. Randa Evens, MD, MPH [facility] [phone] 12/14/2020 4:36 PM

## 2020-12-20 ENCOUNTER — Other Ambulatory Visit: Payer: Self-pay

## 2020-12-20 ENCOUNTER — Ambulatory Visit
Admission: RE | Admit: 2020-12-20 | Discharge: 2020-12-20 | Disposition: A | Discharge status: Home / Self Care | Payer: BC Managed Care – PPO | Source: Ambulatory Visit | Attending: Oncology | Admitting: Oncology

## 2020-12-20 DIAGNOSIS — Z08 Encounter for follow-up examination after completed treatment for malignant neoplasm: Secondary | ICD-10-CM

## 2020-12-20 DIAGNOSIS — Z853 Personal history of malignant neoplasm of breast: Secondary | ICD-10-CM | POA: Insufficient documentation

## 2021-01-19 ENCOUNTER — Other Ambulatory Visit: Payer: Self-pay

## 2021-01-19 ENCOUNTER — Ambulatory Visit: Payer: BC Managed Care – PPO | Admitting: Dermatology

## 2021-01-19 DIAGNOSIS — L821 Other seborrheic keratosis: Secondary | ICD-10-CM

## 2021-01-19 DIAGNOSIS — D485 Neoplasm of uncertain behavior of skin: Secondary | ICD-10-CM

## 2021-01-19 DIAGNOSIS — L578 Other skin changes due to chronic exposure to nonionizing radiation: Secondary | ICD-10-CM | POA: Diagnosis not present

## 2021-01-19 DIAGNOSIS — D220 Melanocytic nevi of lip: Secondary | ICD-10-CM

## 2021-01-19 DIAGNOSIS — L820 Inflamed seborrheic keratosis: Secondary | ICD-10-CM | POA: Diagnosis not present

## 2021-01-19 DIAGNOSIS — L814 Other melanin hyperpigmentation: Secondary | ICD-10-CM

## 2021-01-19 DIAGNOSIS — D229 Melanocytic nevi, unspecified: Secondary | ICD-10-CM

## 2021-01-19 DIAGNOSIS — D1800 Hemangioma unspecified site: Secondary | ICD-10-CM

## 2021-01-19 DIAGNOSIS — Z853 Personal history of malignant neoplasm of breast: Secondary | ICD-10-CM | POA: Diagnosis not present

## 2021-01-19 DIAGNOSIS — Z1283 Encounter for screening for malignant neoplasm of skin: Secondary | ICD-10-CM | POA: Diagnosis not present

## 2021-01-19 NOTE — Patient Instructions (Signed)
Wound Care Instructions  Cleanse wound gently with soap and water once a day then pat dry with clean gauze. Apply a thing coat of Petrolatum (petroleum jelly, "Vaseline") over the wound (unless you have an allergy to this). We recommend that you use a new, sterile tube of Vaseline. Do not pick or remove scabs. Do not remove the yellow or white "healing tissue" from the base of the wound.  Cover the wound with fresh, clean, nonstick gauze and secure with paper tape. You may use Band-Aids in place of gauze and tape if the would is small enough, but would recommend trimming much of the tape off as there is often too much. Sometimes Band-Aids can irritate the skin.  You should call the office for your biopsy report after 1 week if you have not already been contacted.  If you experience any problems, such as abnormal amounts of bleeding, swelling, significant bruising, significant pain, or evidence of infection, please call the office immediately.  FOR ADULT SURGERY PATIENTS: If you need something for pain relief you may take 1 extra strength Tylenol (acetaminophen) AND 2 Ibuprofen (200mg each) together every 4 hours as needed for pain. (do not take these if you are allergic to them or if you have a reason you should not take them.) Typically, you may only need pain medication for 1 to 3 days.     Melanoma ABCDEs  Melanoma is the most dangerous type of skin cancer, and is the leading cause of death from skin disease.  You are more likely to develop melanoma if you: Have light-colored skin, light-colored eyes, or red or blond hair Spend a lot of time in the sun Tan regularly, either outdoors or in a tanning bed Have had blistering sunburns, especially during childhood Have a close family member who has had a melanoma Have atypical moles or large birthmarks  Early detection of melanoma is key since treatment is typically straightforward and cure rates are extremely high if we catch it early.   The  first sign of melanoma is often a change in a mole or a new dark spot.  The ABCDE system is a way of remembering the signs of melanoma.  A for asymmetry:  The two halves do not match. B for border:  The edges of the growth are irregular. C for color:  A mixture of colors are present instead of an even brown color. D for diameter:  Melanomas are usually (but not always) greater than 6mm - the size of a pencil eraser. E for evolution:  The spot keeps changing in size, shape, and color.  Please check your skin once per month between visits. You can use a small mirror in front and a large mirror behind you to keep an eye on the back side or your body.   If you see any new or changing lesions before your next follow-up, please call to schedule a visit.  Please continue daily skin protection including broad spectrum sunscreen SPF 30+ to sun-exposed areas, reapplying every 2 hours as needed when you're outdoors.    If you have any questions or concerns for your doctor, please call our main line at [phone] and press option 4 to reach your doctor's medical assistant. If no one answers, please leave a voicemail as directed and we will return your call as soon as possible. Messages left after 4 pm will be answered the following business day.   You may also send us a message via MyChart. We   typically respond to MyChart messages within 1-2 business days.  For prescription refills, please ask your pharmacy to contact our office. Our fax number is [phone].  If you have an urgent issue when the clinic is closed that cannot wait until the next business day, you can page your doctor at the number below.    Please note that while we do our best to be available for urgent issues outside of office hours, we are not available 24/7.   If you have an urgent issue and are unable to reach us, you may choose to seek medical care at your doctor's office, retail clinic, urgent care center, or emergency  room.  If you have a medical emergency, please immediately call 911 or go to the emergency department.  Pager Numbers  - Dr. Kowalski: [phone]  - Dr. Moye: [phone]  - Dr. Stewart: [phone]  In the event of inclement weather, please call our main line at [phone] for an update on the status of any delays or closures.  Dermatology Medication Tips: Please keep the boxes that topical medications come in in order to help keep track of the instructions about where and how to use these. Pharmacies typically print the medication instructions only on the boxes and not directly on the medication tubes.   If your medication is too expensive, please contact our office at [phone] option 4 or send us a message through MyChart.   We are unable to tell what your co-pay for medications will be in advance as this is different depending on your insurance coverage. However, we may be able to find a substitute medication at lower cost or fill out paperwork to get insurance to cover a needed medication.   If a prior authorization is required to get your medication covered by your insurance company, please allow us 1-2 business days to complete this process.  Drug prices often vary depending on where the prescription is filled and some pharmacies may offer cheaper prices.  The website www.goodrx.com contains coupons for medications through different pharmacies. The prices here do not account for what the cost may be with help from insurance (it may be cheaper with your insurance), but the website can give you the price if you did not use any insurance.  - You can print the associated coupon and take it with your prescription to the pharmacy.  - You may also stop by our office during regular business hours and pick up a GoodRx coupon card.  - If you need your prescription sent electronically to a different pharmacy, notify our office through [place] MyChart or by phone at [phone]  option 4.  

## 2021-01-19 NOTE — Progress Notes (Signed)
Follow-Up Visit   Subjective  Tabitha Tucker is a 60 y.o. female who presents for the following: TBSE (Patient here for full body skin exam and skin cancer screening. Patient with no hx of skin cancer or abnormal moles. Patient does have 2 spots at lip that she would like removed, present for many years but have become bothersome for patient. ). The patient presents for Total-Body Skin Exam (TBSE) for skin cancer screening and mole check.  The following portions of the chart were reviewed this encounter and updated as appropriate:   Tobacco  Allergies  Meds  Problems  Med Hx  Surg Hx  Fam Hx     Review of Systems:  No other skin or systemic complaints except as noted in HPI or Assessment and Plan.  Objective  Well appearing patient in no apparent distress; mood and affect are within normal limits.  A full examination was performed including scalp, head, eyes, ears, nose, lips, neck, chest, axillae, abdomen, back, buttocks, bilateral upper extremities, bilateral lower extremities, hands, feet, fingers, toes, fingernails, and toenails. All findings within normal limits unless otherwise noted below.  right upper lip 0.6cm flesh colored papule Irritated Nevus r/o Dysplasia     right upper lip vermillion and vermillion border 0.4cm flesh colored papule  Left Breast No lymphadenopathy  Right Thigh Erythematous keratotic or waxy stuck-on papule or plaque.    Assessment & Plan  Neoplasm of uncertain behavior of skin right upper lip Epidermal / dermal shaving  Lesion diameter (cm):  0.6 Informed consent: discussed and consent obtained   Timeout: patient name, date of birth, surgical site, and procedure verified   Procedure prep:  Patient was prepped and draped in usual sterile fashion Prep type:  Isopropyl alcohol Anesthesia: the lesion was anesthetized in a standard fashion   Anesthetic:  1% lidocaine w/ epinephrine 1-100,000 buffered w/ 8.4% NaHCO3 Instrument used:  flexible razor blade   Hemostasis achieved with: pressure, aluminum chloride and electrodesiccation   Outcome: patient tolerated procedure well   Post-procedure details: sterile dressing applied and wound care instructions given   Dressing type: bandage and petrolatum    Specimen 1 - Surgical pathology Differential Diagnosis: Irritated Nevus r/o Dysplasia  Check Margins: No 0.6cm flesh colored papule Irritated Nevus r/o Dysplasia  Nevus right upper lip vermillion and vermillion border -smaller and medial to above lesion removed today. May biopsy at future appointment  History of breast cancer Left Breast No lymphadenopathy Skin clear at chest today.  Inflamed seborrheic keratosis Right Thigh  Destruction of lesion - Right Thigh Complexity: simple   Destruction method: cryotherapy   Informed consent: discussed and consent obtained   Timeout:  patient name, date of birth, surgical site, and procedure verified Lesion destroyed using liquid nitrogen: Yes   Region frozen until ice ball extended beyond lesion: Yes   Outcome: patient tolerated procedure well with no complications   Post-procedure details: wound care instructions given    Lentigines - Scattered tan macules - Due to sun exposure - Benign-appering, observe - Recommend daily broad spectrum sunscreen SPF 30+ to sun-exposed areas, reapply every 2 hours as needed. - Call for any changes  Seborrheic Keratoses - Stuck-on, waxy, tan-brown papules and/or plaques  - Benign-appearing - Discussed benign etiology and prognosis. - Observe - Call for any changes  Melanocytic Nevi - Tan-brown and/or pink-flesh-colored symmetric macules and papules - Benign appearing on exam today - Observation - Call clinic for new or changing moles - Recommend daily use of  broad spectrum spf 30+ sunscreen to sun-exposed areas.   Hemangiomas - Red papules - Discussed benign nature - Observe - Call for any changes  Actinic  Damage - Chronic condition, secondary to cumulative UV/sun exposure - diffuse scaly erythematous macules with underlying dyspigmentation - Recommend daily broad spectrum sunscreen SPF 30+ to sun-exposed areas, reapply every 2 hours as needed.  - Staying in the shade or wearing long sleeves, sun glasses (UVA+UVB protection) and wide brim hats (4-inch brim around the entire circumference of the hat) are also recommended for sun protection.  - Call for new or changing lesions.  Skin cancer screening performed today.  Return in about 1 year (around 01/19/2022) for TBSE.  Graciella Belton, RMA, am acting as scribe for Sarina Ser, MD . Documentation: I have reviewed the above documentation for accuracy and completeness, and I agree with the above.  Sarina Ser, MD

## 2021-01-20 ENCOUNTER — Encounter: Payer: Self-pay | Admitting: Dermatology

## 2021-02-01 ENCOUNTER — Telehealth: Payer: Self-pay

## 2021-02-01 NOTE — Telephone Encounter (Signed)
-----   Message from Ralene Bathe, MD sent at 02/01/2021 10:56 AM EDT ----- Diagnosis Skin , right upper lip MELANOCYTIC NEVUS, INTRADERMAL TYPE, IRRITATED  Benign irritated mole No further treatment needed

## 2021-02-01 NOTE — Telephone Encounter (Signed)
Left message for patient to call office for results/hd 

## 2021-02-10 ENCOUNTER — Telehealth: Payer: Self-pay

## 2021-02-10 NOTE — Telephone Encounter (Signed)
Advised patient of results/hd  

## 2021-02-10 NOTE — Telephone Encounter (Signed)
-----   Message from Ralene Bathe, MD sent at 02/01/2021 10:56 AM EDT ----- Diagnosis Skin , right upper lip MELANOCYTIC NEVUS, INTRADERMAL TYPE, IRRITATED  Benign irritated mole No further treatment needed

## 2021-06-15 ENCOUNTER — Encounter: Payer: Self-pay | Admitting: Oncology

## 2021-06-15 ENCOUNTER — Inpatient Hospital Stay: Payer: BC Managed Care – PPO | Attending: Oncology | Admitting: Oncology

## 2021-06-15 ENCOUNTER — Other Ambulatory Visit: Payer: Self-pay

## 2021-06-15 VITALS — BP 148/86 | HR 75 | Temp 98.0°F | Ht 64.0 in | Wt 139.5 lb

## 2021-06-15 DIAGNOSIS — Z170 Estrogen receptor positive status [ER+]: Secondary | ICD-10-CM | POA: Insufficient documentation

## 2021-06-15 DIAGNOSIS — M791 Myalgia, unspecified site: Secondary | ICD-10-CM | POA: Insufficient documentation

## 2021-06-15 DIAGNOSIS — C50412 Malignant neoplasm of upper-outer quadrant of left female breast: Secondary | ICD-10-CM | POA: Diagnosis not present

## 2021-06-15 DIAGNOSIS — M7918 Myalgia, other site: Secondary | ICD-10-CM

## 2021-06-15 DIAGNOSIS — Z79899 Other long term (current) drug therapy: Secondary | ICD-10-CM | POA: Insufficient documentation

## 2021-06-15 DIAGNOSIS — Z79811 Long term (current) use of aromatase inhibitors: Secondary | ICD-10-CM | POA: Diagnosis not present

## 2021-06-15 NOTE — Progress Notes (Signed)
Pt states has some tingling in her lt hand at times.

## 2021-06-15 NOTE — Progress Notes (Signed)
Hematology/Oncology Consult note [facility]  [phone] [phone]  Patient Care Team: Ward, Honor Loh, MD as PCP - General (Obstetrics and Gynecology) Sindy Guadeloupe, MD as Consulting Physician (Hematology and Oncology)   Name of the patient: Tabitha Tucker  [id number]  May 28, 1961   Date of visit: 06/16/21  Diagnosis-stage I ER positive breast cancer on Arimidex  Chief complaint/ Reason for visit-routine follow-up of breast cancer on Arimidex  Heme/Onc history: patient is a 60 year old Caucasian female who recently underwent a screening mammogram which showed 1.7 x 1.2 x 1.6 cm hypoechoic irregular area in her left breast at 1 o'clock position.  No evidence of left axillary adenopathy.  This was 5 biopsied and was invasive mammary carcinoma grade 2, ER greater than 90% positive PR 11 to 50% positive and HER-2/neu negative.  Patient will be seeing surgery tomorrow.  She has never had prior breast biopsies.  She is G2 P2 L2.  She has had a hysterectomy many years ago.  No significant family history of breast cancer, ovarian cancer, colon gastric or pancreatic cancer.    Patient underwent lumpectomy with sentinel lymph node biopsy on 07/25/2018.  Final pathology showed invasive mammary carcinoma 1.7 cm, grade 2 with negative margins.  2 out of 2 sentinel lymph nodes were positive for malignancy.  One lymph node was involved by micrometastatic carcinoma 1.5 mm and the other lymph node was involved with macro metastatic disease 5 mm.  There was focal microscopy extracapsular extension present which I verified with Dr. Reuel Derby from pathology.  pT1c pN1A.  Systemic CT scans denied by insurance.  She completed adjuvant radiation treatment   Patient underwent MammaPrint testing which came back as low risk with no significant benefit from adjuvant chemotherapy.  Patient started taking Arimidex in May 2020.   Interval history-reports overall doing well.  Has  been compliant with her Arimidex.  Has noticed some increasing low back pain and sporadic joint pain.  Unsure if this is related to the medication.  Over the past few weeks while sleeping she feels twinges in her left and right axilla that radiate towards her breast.  States this sensation is inconsistent but happens several times per week.  She is scheduled for mammogram next week.  ECOG PS- 0 Pain scale- 0   Review of systems- Review of Systems  Constitutional:  Positive for malaise/fatigue.  Musculoskeletal:  Positive for back pain, joint pain and myalgias.  Psychiatric/Behavioral:  The patient has insomnia.       No Known Allergies   Past Medical History:  Diagnosis Date   Breast cancer ([place]) 07/2018   High cholesterol    Panic attacks    Personal history of radiation therapy    PONV (postoperative nausea and vomiting)      Past Surgical History:  Procedure Laterality Date   ABDOMINAL HYSTERECTOMY     pt did keep ovaries   APPENDECTOMY     ate age 77   BREAST BIOPSY Left 07/10/2018   INVASIVE MAMMARY CARCINOMA   BREAST LUMPECTOMY Left 07/25/2018   [facility],    BREAST LUMPECTOMY WITH NEEDLE LOCALIZATION AND AXILLARY SENTINEL LYMPH NODE BX Left 07/25/2018   Procedure: BREAST LUMPECTOMY WITH NEEDLE LOCALIZATION AND SENTINEL NODE BX;  Surgeon: Benjamine Sprague, DO;  Location: [facility] ORS;  Service: General;  Laterality: Left;    Social History   Socioeconomic History   Marital status: Married    Spouse name: Not on file   Number of  children: Not on file   Years of education: Not on file   Highest education level: Not on file  Occupational History   Not on file  Tobacco Use   Smoking status: Never   Smokeless tobacco: Never  Vaping Use   Vaping Use: Never used  Substance and Sexual Activity   Alcohol use: Yes    Alcohol/week: 14.0 standard drinks    Types: 14 Glasses of wine per week    Comment: 2 glasses of wine every day   Drug use: Not Currently    Types: Marijuana     Comment: marijuana when she was a teenager a few time   Sexual activity: Yes  Other Topics Concern   Not on file  Social History Narrative   Not on file   Social Determinants of Health   Financial Resource Strain: Not on file  Food Insecurity: Not on file  Transportation Needs: Not on file  Physical Activity: Not on file  Stress: Not on file  Social Connections: Not on file  Intimate Partner Violence: Not on file    Family History  Problem Relation Age of Onset   Hypertension Father    Breast cancer Neg Hx      Current Outpatient Medications:    ALPRAZolam (XANAX) 0.25 MG tablet, Take 0.25 mg by mouth 2 (two) times daily as needed for anxiety., Disp: , Rfl:    anastrozole (ARIMIDEX) 1 MG tablet, Take 1 tablet (1 mg total) by mouth daily., Disp: 90 tablet, Rfl: 2   B Complex-C (SUPER B COMPLEX PO), Take 1 tablet by mouth daily., Disp: , Rfl:    Calcium Carbonate-Vit D-Min (CALCIUM 600+D PLUS MINERALS) 600-400 MG-UNIT TABS, Take 2 tablets by mouth daily., Disp: , Rfl:    Garlic 9179 MG CAPS, Take 1,000 mg by mouth daily., Disp: , Rfl:    simvastatin (ZOCOR) 10 MG tablet, Take 10 mg by mouth daily., Disp: , Rfl:   Physical exam:  Physical Exam Constitutional:      Appearance: Normal appearance.  HENT:     Head: Normocephalic and atraumatic.  Eyes:     Pupils: Pupils are equal, round, and reactive to light.  Cardiovascular:     Rate and Rhythm: Normal rate and regular rhythm.     Heart sounds: Normal heart sounds. No murmur heard. Pulmonary:     Effort: Pulmonary effort is normal.     Breath sounds: Normal breath sounds. No wheezing.  Chest:     Chest wall: No mass, deformity or tenderness.     Comments: No reproducible discomfort on breast exam today.  No identifiable mass or lesion in either breast.  She is scheduled for mammogram next week.   Abdominal:     General: Bowel sounds are normal. There is no distension.     Palpations: Abdomen is soft.      Tenderness: There is no abdominal tenderness.  Musculoskeletal:        General: Normal range of motion.     Cervical back: Normal range of motion.  Skin:    General: Skin is warm and dry.     Findings: No rash.  Neurological:     Mental Status: She is alert and oriented to person, place, and time.  Psychiatric:        Judgment: Judgment normal.     CMP Latest Ref Rng & Units 06/14/2020  Glucose 70 - 99 mg/dL 100(H)  BUN 6 - 20 mg/dL 15  Creatinine 0.44 - 1.00  mg/dL 0.86  Sodium 135 - 145 mmol/L 139  Potassium 3.5 - 5.1 mmol/L 4.3  Chloride 98 - 111 mmol/L 101  CO2 22 - 32 mmol/L 28  Calcium 8.9 - 10.3 mg/dL 9.9  Total Protein 6.5 - 8.1 g/dL 7.3  Total Bilirubin 0.3 - 1.2 mg/dL 0.7  Alkaline Phos 38 - 126 U/L 61  AST 15 - 41 U/L 19  ALT 0 - 44 U/L 15   CBC Latest Ref Rng & Units 12/08/2019  WBC 4.0 - 10.5 K/uL 5.2  Hemoglobin 12.0 - 15.0 g/dL 13.9  Hematocrit 36.0 - 46.0 % 40.9  Platelets 150 - 400 K/uL 270     Assessment and plan- Patient is a 60 y.o. female with pathological prognostic stage Ia invasive mammary carcinoma of the left breast ER/PR positive HER-2 negative s/p lumpectomy adjuvant radiation therapy and currently on Arimidex.This is a routine f/u visit for breast cancer.   Clinically she continues to do well with no concerning signs of recurrence based on today's exam.  She continues to tolerate Arimidex along with calcium and vitamin D.  Describes some intermittent back pain and joint pain unclear if this is related to Arimidex but have asked her to keep an eye on this.  Bone density scan from 12/20/2020 showed a T score of -0.6.  She is scheduled for a mammogram next week.  Disposition- Mammogram as scheduled next week. RTC in 6 months for follow-up with Dr. Janese Banks.   Visit Diagnosis 1. Malignant neoplasm of upper-outer quadrant of left breast in female, estrogen receptor positive ([place])   2. Myalgia, multiple sites   3. Aromatase inhibitor use      Dr.  Randa Evens, MD, MPH [facility] [phone] 06/16/2021 8:08 AM

## 2021-06-30 ENCOUNTER — Ambulatory Visit
Admission: RE | Admit: 2021-06-30 | Discharge: 2021-06-30 | Disposition: A | Discharge status: Home / Self Care | Payer: BC Managed Care – PPO | Source: Ambulatory Visit | Attending: Oncology | Admitting: Oncology

## 2021-06-30 ENCOUNTER — Other Ambulatory Visit: Payer: Self-pay

## 2021-06-30 DIAGNOSIS — Z08 Encounter for follow-up examination after completed treatment for malignant neoplasm: Secondary | ICD-10-CM | POA: Diagnosis present

## 2021-06-30 DIAGNOSIS — Z853 Personal history of malignant neoplasm of breast: Secondary | ICD-10-CM | POA: Diagnosis present

## 2021-06-30 DIAGNOSIS — Z170 Estrogen receptor positive status [ER+]: Secondary | ICD-10-CM | POA: Diagnosis present

## 2021-06-30 DIAGNOSIS — C50412 Malignant neoplasm of upper-outer quadrant of left female breast: Secondary | ICD-10-CM | POA: Insufficient documentation

## 2021-09-22 ENCOUNTER — Other Ambulatory Visit: Payer: Self-pay | Admitting: Nurse Practitioner

## 2021-09-22 ENCOUNTER — Other Ambulatory Visit (HOSPITAL_COMMUNITY): Payer: Self-pay | Admitting: Nurse Practitioner

## 2021-09-22 DIAGNOSIS — R634 Abnormal weight loss: Secondary | ICD-10-CM

## 2021-09-22 DIAGNOSIS — R1013 Epigastric pain: Secondary | ICD-10-CM

## 2021-09-22 DIAGNOSIS — N50819 Testicular pain, unspecified: Secondary | ICD-10-CM | POA: Insufficient documentation

## 2021-10-11 ENCOUNTER — Other Ambulatory Visit: Payer: Self-pay | Admitting: Oncology

## 2021-10-12 ENCOUNTER — Ambulatory Visit: Payer: BC Managed Care – PPO

## 2021-11-16 ENCOUNTER — Ambulatory Visit: Payer: BC Managed Care – PPO | Admitting: Radiation Oncology

## 2021-11-24 ENCOUNTER — Telehealth: Payer: Self-pay | Admitting: *Deleted

## 2021-11-24 NOTE — Telephone Encounter (Signed)
Patient called reporting that she is having pain in her axilla and the breast she has cancer in. She states that she has an appointment 6/16 with [facility], but is asking if she needs to be seen before then or just wait until the 16 th. Please advise

## 2021-11-25 ENCOUNTER — Other Ambulatory Visit: Payer: Self-pay | Admitting: Medical Oncology

## 2021-11-25 ENCOUNTER — Inpatient Hospital Stay: Payer: BC Managed Care – PPO | Attending: Internal Medicine | Admitting: Medical Oncology

## 2021-11-25 ENCOUNTER — Encounter: Payer: Self-pay | Admitting: Medical Oncology

## 2021-11-25 VITALS — BP 133/85 | HR 97 | Temp 97.1°F | Resp 17

## 2021-11-25 DIAGNOSIS — Z170 Estrogen receptor positive status [ER+]: Secondary | ICD-10-CM

## 2021-11-25 DIAGNOSIS — Z90710 Acquired absence of both cervix and uterus: Secondary | ICD-10-CM | POA: Diagnosis not present

## 2021-11-25 DIAGNOSIS — Z79899 Other long term (current) drug therapy: Secondary | ICD-10-CM | POA: Insufficient documentation

## 2021-11-25 DIAGNOSIS — Z923 Personal history of irradiation: Secondary | ICD-10-CM | POA: Insufficient documentation

## 2021-11-25 DIAGNOSIS — N644 Mastodynia: Secondary | ICD-10-CM

## 2021-11-25 DIAGNOSIS — Z79811 Long term (current) use of aromatase inhibitors: Secondary | ICD-10-CM | POA: Diagnosis not present

## 2021-11-25 DIAGNOSIS — C50412 Malignant neoplasm of upper-outer quadrant of left female breast: Secondary | ICD-10-CM | POA: Insufficient documentation

## 2021-11-25 NOTE — Progress Notes (Signed)
Symptom Management [place] at [facility] [phone] [phone]  Patient Care Team: Ward, Honor Loh, MD as PCP - General (Obstetrics and Gynecology) Sindy Guadeloupe, MD as Consulting Physician (Hematology and Oncology)   Name of the patient: Tabitha Tucker  [id number]  1960-09-10   Date of visit: 11/25/21  Reason for Consult: TENAYA HILYER is a 61 y.o. female who presents today for:  Breast Pain: Pt reports that over the past 3 weeks she has had left and right breast pain. Left breast pain felt tingling/sharp/itchy at times around her breast, chest and left arm. Last night this improved but the right side now feels sore. No rash, fevers, nipple discharge, palpable masses. Low caffeine intake. Has had weight loss but this is attributed to diet change. Last mammogram was on 06/30/2021 which was benign.   Denies any neurologic complaints. Denies recent fevers or illnesses. Denies any easy bleeding or bruising. Reports good appetite. Denies chest pain. Denies any nausea, vomiting, constipation, or diarrhea. Denies urinary complaints. Patient offers no further specific complaints today.    PAST MEDICAL HISTORY: Past Medical History:  Diagnosis Date   Breast cancer ([place]) 07/2018   High cholesterol    Panic attacks    Personal history of radiation therapy    PONV (postoperative nausea and vomiting)     PAST SURGICAL HISTORY:  Past Surgical History:  Procedure Laterality Date   ABDOMINAL HYSTERECTOMY     pt did keep ovaries   APPENDECTOMY     ate age 18   BREAST BIOPSY Left 07/10/2018   INVASIVE MAMMARY CARCINOMA   BREAST LUMPECTOMY Left 07/25/2018   [place],    BREAST LUMPECTOMY WITH NEEDLE LOCALIZATION AND AXILLARY SENTINEL LYMPH NODE BX Left 07/25/2018   Procedure: BREAST LUMPECTOMY WITH NEEDLE LOCALIZATION AND SENTINEL NODE BX;  Surgeon: Benjamine Sprague, DO;  Location: [facility] ORS;  Service: General;  Laterality: Left;     HEMATOLOGY/ONCOLOGY HISTORY:  Oncology History  Malignant neoplasm of left female breast ([place])  07/16/2018 Cancer Staging   Staging form: Breast, AJCC 8th Edition - Clinical stage from 07/16/2018: Stage IA (cT1c, cN0, cM0, G2, ER+, PR+, HER2-) - Signed by Sindy Guadeloupe, MD on 07/18/2018    07/18/2018 Initial Diagnosis   Malignant neoplasm of left female breast ([place])    08/01/2018 Cancer Staging   Staging form: Breast, AJCC 8th Edition - Pathologic: Stage IA (pT1c, pN1a, cM0, G2, ER+, PR+, HER2-) - Signed by Sindy Guadeloupe, MD on 08/01/2018      ALLERGIES:  has No Known Allergies.  MEDICATIONS:  Current Outpatient Medications  Medication Sig Dispense Refill   anastrozole (ARIMIDEX) 1 MG tablet TAKE 1 TABLET BY MOUTH DAILY. 90 tablet 2   B Complex-C (SUPER B COMPLEX PO) Take 1 tablet by mouth daily.     Calcium Carbonate-Vit D-Min (CALCIUM 600+D PLUS MINERALS) 600-400 MG-UNIT TABS Take 2 tablets by mouth daily.     Garlic 2244 MG CAPS Take 1,000 mg by mouth daily.     simvastatin (ZOCOR) 10 MG tablet Take 10 mg by mouth daily.     ALPRAZolam (XANAX) 0.25 MG tablet Take 0.25 mg by mouth 2 (two) times daily as needed for anxiety. (Patient not taking: Reported on 11/25/2021)     No current facility-administered medications for this visit.    VITAL SIGNS: BP 133/85 (BP Location: Right Arm, Patient Position: Sitting)   Pulse 97   Temp (!) 97.1 F (36.2 C) (Tympanic)  Resp 17   SpO2 98%  There were no vitals filed for this visit.  Estimated body mass index is 23.95 kg/m as calculated from the following:   Height as of 06/15/21: '5\' 4"'  (1.626 m).   Weight as of 06/15/21: 139 lb 8 oz (63.3 kg).  LABS: CBC:    Component Value Date/Time   WBC 5.2 12/08/2019 1157   HGB 13.9 12/08/2019 1157   HGB 15.1 09/10/2013 0932   HCT 40.9 12/08/2019 1157   HCT 44.3 09/10/2013 0932   PLT 270 12/08/2019 1157   PLT 282 09/10/2013 0932   MCV 96.2 12/08/2019 1157   MCV 98 09/10/2013 0932    NEUTROABS 3.7 12/08/2019 1157   LYMPHSABS 1.0 12/08/2019 1157   MONOABS 0.4 12/08/2019 1157   EOSABS 0.1 12/08/2019 1157   BASOSABS 0.0 12/08/2019 1157   Comprehensive Metabolic Panel:    Component Value Date/Time   NA 139 06/14/2020 1506   NA 139 09/10/2013 0932   K 4.3 06/14/2020 1506   K 4.1 09/10/2013 0932   CL 101 06/14/2020 1506   CL 106 09/10/2013 0932   CO2 28 06/14/2020 1506   CO2 28 09/10/2013 0932   BUN 15 06/14/2020 1506   BUN 11 09/10/2013 0932   CREATININE 0.86 06/14/2020 1506   CREATININE 0.85 09/10/2013 0932   GLUCOSE 100 (H) 06/14/2020 1506   GLUCOSE 87 09/10/2013 0932   CALCIUM 9.9 06/14/2020 1506   CALCIUM 9.9 09/10/2013 0932   AST 19 06/14/2020 1506   AST 24 09/10/2013 0932   ALT 15 06/14/2020 1506   ALT 18 09/10/2013 0932   ALKPHOS 61 06/14/2020 1506   ALKPHOS 82 09/10/2013 0932   BILITOT 0.7 06/14/2020 1506   BILITOT 0.6 09/10/2013 0932   PROT 7.3 06/14/2020 1506   PROT 8.6 (H) 09/10/2013 0932   ALBUMIN 4.2 06/14/2020 1506   ALBUMIN 4.3 09/10/2013 0932    RADIOGRAPHIC STUDIES: No results found.  PERFORMANCE STATUS (ECOG) : 1 - Symptomatic but completely ambulatory  Review of Systems Unless otherwise noted, a complete review of systems is negative.  Physical Exam General: NAD Cardiovascular: regular rate and rhythm Pulmonary: clear ant fields Extremities: no edema, no joint deformities Breast: No axillary adenopathy, palpable masses, nipple discharge or breast tenderness to palpation Skin: no rashes Neurological: Weakness but otherwise nonfocal  Assessment and Plan- Patient is a 61 y.o. female    Encounter Diagnoses  Name Primary?   Malignant neoplasm of upper-outer quadrant of left breast in female, estrogen receptor positive ([place]) Yes   Breast tenderness in female    New. Question if she may have had shingles without the rash- past antiviral benefit and symptoms improved. Monitoring recommended. For patients mental comfort she  requests imaging which I feel is appropriate given her symptoms and history. Bilateral diagnostic mammogram and [place] pending.    Patient expressed understanding and was in agreement with this plan. She also understands that She can call clinic at any time with any questions, concerns, or complaints.   Thank you for allowing me to participate in the care of this very pleasant patient.   Time Total: 25  Visit consisted of counseling and education dealing with the complex and emotionally intense issues of symptom management in the setting of serious illness.Greater than 50%  of this time was spent counseling and coordinating care related to the above assessment and plan.  Signed by: Nelwyn Salisbury, PA-C

## 2021-11-25 NOTE — Progress Notes (Signed)
3 weeks ago, prickly sensation under L arm, now has aching. Breast swelling was there but last night it went away. Now she is having tenderness in R breast and axilla area. Pt has lost 20 lbs intentionally recently. Taking arimidex daily.

## 2021-11-30 ENCOUNTER — Other Ambulatory Visit: Payer: Self-pay | Admitting: Nurse Practitioner

## 2021-11-30 DIAGNOSIS — R1013 Epigastric pain: Secondary | ICD-10-CM

## 2021-11-30 DIAGNOSIS — R1901 Right upper quadrant abdominal swelling, mass and lump: Secondary | ICD-10-CM

## 2021-12-01 ENCOUNTER — Other Ambulatory Visit: Payer: Self-pay | Admitting: Nurse Practitioner

## 2021-12-01 DIAGNOSIS — F411 Generalized anxiety disorder: Secondary | ICD-10-CM | POA: Insufficient documentation

## 2021-12-01 DIAGNOSIS — R1901 Right upper quadrant abdominal swelling, mass and lump: Secondary | ICD-10-CM

## 2021-12-01 DIAGNOSIS — R1013 Epigastric pain: Secondary | ICD-10-CM

## 2021-12-01 DIAGNOSIS — K2970 Gastritis, unspecified, without bleeding: Secondary | ICD-10-CM | POA: Insufficient documentation

## 2021-12-06 ENCOUNTER — Ambulatory Visit
Admission: RE | Admit: 2021-12-06 | Discharge: 2021-12-06 | Disposition: A | Discharge status: Home / Self Care | Payer: BC Managed Care – PPO | Source: Ambulatory Visit | Attending: Medical Oncology | Admitting: Medical Oncology

## 2021-12-06 DIAGNOSIS — C50412 Malignant neoplasm of upper-outer quadrant of left female breast: Secondary | ICD-10-CM

## 2021-12-06 DIAGNOSIS — N644 Mastodynia: Secondary | ICD-10-CM | POA: Diagnosis present

## 2021-12-06 DIAGNOSIS — Z170 Estrogen receptor positive status [ER+]: Secondary | ICD-10-CM | POA: Insufficient documentation

## 2021-12-16 ENCOUNTER — Ambulatory Visit: Payer: BC Managed Care – PPO | Admitting: Medical Oncology

## 2021-12-19 ENCOUNTER — Other Ambulatory Visit: Payer: BC Managed Care – PPO

## 2022-01-25 ENCOUNTER — Ambulatory Visit: Payer: BC Managed Care – PPO | Admitting: Dermatology

## 2022-01-25 DIAGNOSIS — L918 Other hypertrophic disorders of the skin: Secondary | ICD-10-CM

## 2022-01-25 DIAGNOSIS — D229 Melanocytic nevi, unspecified: Secondary | ICD-10-CM

## 2022-01-25 DIAGNOSIS — L578 Other skin changes due to chronic exposure to nonionizing radiation: Secondary | ICD-10-CM

## 2022-01-25 DIAGNOSIS — Z1283 Encounter for screening for malignant neoplasm of skin: Secondary | ICD-10-CM | POA: Diagnosis not present

## 2022-01-25 DIAGNOSIS — D220 Melanocytic nevi of lip: Secondary | ICD-10-CM | POA: Diagnosis not present

## 2022-01-25 DIAGNOSIS — L821 Other seborrheic keratosis: Secondary | ICD-10-CM

## 2022-01-25 DIAGNOSIS — D1800 Hemangioma unspecified site: Secondary | ICD-10-CM

## 2022-01-25 DIAGNOSIS — L814 Other melanin hyperpigmentation: Secondary | ICD-10-CM

## 2022-01-25 DIAGNOSIS — L738 Other specified follicular disorders: Secondary | ICD-10-CM

## 2022-01-25 DIAGNOSIS — Z853 Personal history of malignant neoplasm of breast: Secondary | ICD-10-CM

## 2022-01-25 NOTE — Progress Notes (Signed)
Follow-Up Visit   Subjective  Tabitha Tucker is a 61 y.o. female who presents for the following: Total body skin exam (Check spots face itchy prn) and recheck nevi (R upper lip vermillion and vermillion border). The patient presents for Total-Body Skin Exam (TBSE) for skin cancer screening and mole check.  The patient has spots, moles and lesions to be evaluated, some may be new or changing and the patient has concerns that these could be cancer.   The following portions of the chart were reviewed this encounter and updated as appropriate:   Tobacco  Allergies  Meds  Problems  Med Hx  Surg Hx  Fam Hx     Review of Systems:  No other skin or systemic complaints except as noted in HPI or Assessment and Plan.  Objective  Well appearing patient in no apparent distress; mood and affect are within normal limits.  A full examination was performed including scalp, head, eyes, ears, nose, lips, neck, chest, axillae, abdomen, back, buttocks, bilateral upper extremities, bilateral lower extremities, hands, feet, fingers, toes, fingernails, and toenails. All findings within normal limits unless otherwise noted below.  R upper lip vermillion, R upper lip vermillion border Flesh paps   Assessment & Plan   Lentigines - Scattered tan macules - Due to sun exposure - Benign-appearing, observe - Recommend daily broad spectrum sunscreen SPF 30+ to sun-exposed areas, reapply every 2 hours as needed. - Call for any changes - arms, back  Seborrheic Keratoses - Stuck-on, waxy, tan-brown papules and/or plaques  - Benign-appearing - Discussed benign etiology and prognosis. - Observe - Call for any changes - trunk, arms  Melanocytic Nevi - Tan-brown and/or pink-flesh-colored symmetric macules and papules - Benign appearing on exam today - Observation - Call clinic for new or changing moles - Recommend daily use of broad spectrum spf 30+ sunscreen to sun-exposed areas.  -  trunk  Hemangiomas - Red papules - Discussed benign nature - Observe - Call for any changes - trunk  Actinic Damage - Chronic condition, secondary to cumulative UV/sun exposure - diffuse scaly erythematous macules with underlying dyspigmentation - Recommend daily broad spectrum sunscreen SPF 30+ to sun-exposed areas, reapply every 2 hours as needed.  - Staying in the shade or wearing long sleeves, sun glasses (UVA+UVB protection) and wide brim hats (4-inch brim around the entire circumference of the hat) are also recommended for sun protection.  - Call for new or changing lesions.  Skin cancer screening performed today.  History of breast cancer - L breast - No lymphadenopathy, skin clear at L breast  Sebaceous Hyperplasia - Small yellow papules with a central dell - Benign - Observe  -Discussed cosmetic procedure, noncovered.  $60 for 1st lesion and $15 for each additional lesion if done on the same day.  Maximum charge $350.  One touch-up treatment included no charge. Discussed risks of treatment including dyspigmentation, small scar, and/or recurrence. Recommend daily broad spectrum sunscreen SPF 30+/photoprotection to treated areas once healed.   Acrochordons (Skin Tags) - Fleshy, skin-colored pedunculated papules - Benign appearing.  - Observe. - If desired, they can be removed with an in office procedure that is not covered by insurance. - Please call the clinic if you notice any new or changing lesions.   Nevus R upper lip vermillion, R upper lip vermillion border  Benign-appearing.  Observation.  Call clinic for new or changing lesions.  Recommend daily use of broad spectrum spf 30+ sunscreen to sun-exposed areas.  Return in about 1 year (around 01/26/2023) for TBSE.  I, Othelia Pulling, RMA, am acting as scribe for Sarina Ser, MD . Documentation: I have reviewed the above documentation for accuracy and completeness, and I agree with the above.  Sarina Ser, MD

## 2022-01-25 NOTE — Patient Instructions (Signed)
Due to recent changes in healthcare laws, you may see results of your pathology and/or laboratory studies on MyChart before the doctors have had a chance to review them. We understand that in some cases there may be results that are confusing or concerning to you. Please understand that not all results are received at the same time and often the doctors may need to interpret multiple results in order to provide you with the best plan of care or course of treatment. Therefore, we ask that you please give us 2 business days to thoroughly review all your results before contacting the office for clarification. Should we see a critical lab result, you will be contacted sooner.   If You Need Anything After Your Visit  If you have any questions or concerns for your doctor, please call our main line at [phone] and press option 4 to reach your doctor's medical assistant. If no one answers, please leave a voicemail as directed and we will return your call as soon as possible. Messages left after 4 pm will be answered the following business day.   You may also send us a message via MyChart. We typically respond to MyChart messages within 1-2 business days.  For prescription refills, please ask your pharmacy to contact our office. Our fax number is [phone].  If you have an urgent issue when the clinic is closed that cannot wait until the next business day, you can page your doctor at the number below.    Please note that while we do our best to be available for urgent issues outside of office hours, we are not available 24/7.   If you have an urgent issue and are unable to reach us, you may choose to seek medical care at your doctor's office, retail clinic, urgent care center, or emergency room.  If you have a medical emergency, please immediately call 911 or go to the emergency department.  Pager Numbers  - Dr. Kowalski: [phone]  - Dr. Moye: [phone]  - Dr. Stewart:  [phone]  In the event of inclement weather, please call our main line at [phone] for an update on the status of any delays or closures.  Dermatology Medication Tips: Please keep the boxes that topical medications come in in order to help keep track of the instructions about where and how to use these. Pharmacies typically print the medication instructions only on the boxes and not directly on the medication tubes.   If your medication is too expensive, please contact our office at [phone] option 4 or send us a message through MyChart.   We are unable to tell what your co-pay for medications will be in advance as this is different depending on your insurance coverage. However, we may be able to find a substitute medication at lower cost or fill out paperwork to get insurance to cover a needed medication.   If a prior authorization is required to get your medication covered by your insurance company, please allow us 1-2 business days to complete this process.  Drug prices often vary depending on where the prescription is filled and some pharmacies may offer cheaper prices.  The website www.goodrx.com contains coupons for medications through different pharmacies. The prices here do not account for what the cost may be with help from insurance (it may be cheaper with your insurance), but the website can give you the price if you did not use any insurance.  - You can print the associated coupon and take it with   your prescription to the pharmacy.  - You may also stop by our office during regular business hours and pick up a GoodRx coupon card.  - If you need your prescription sent electronically to a different pharmacy, notify our office through [facility] MyChart or by phone at [phone] option 4.     Si Usted Necesita Algo Despus de Su Visita  Tambin puede enviarnos un mensaje a travs de MyChart. Por lo general respondemos a los mensajes de MyChart en el transcurso de 1 a 2  das hbiles.  Para renovar recetas, por favor pida a su farmacia que se ponga en contacto con nuestra oficina. Nuestro nmero de fax es el [phone].  Si tiene un asunto urgente cuando la clnica est cerrada y que no puede esperar hasta el siguiente da hbil, puede llamar/localizar a su doctor(a) al nmero que aparece a continuacin.   Por favor, tenga en cuenta que aunque hacemos todo lo posible para estar disponibles para asuntos urgentes fuera del horario de oficina, no estamos disponibles las 24 horas del da, los 7 das de la semana.   Si tiene un problema urgente y no puede comunicarse con nosotros, puede optar por buscar atencin mdica  en el consultorio de su doctor(a), en una clnica privada, en un centro de atencin urgente o en una sala de emergencias.  Si tiene una emergencia mdica, por favor llame inmediatamente al 911 o vaya a la sala de emergencias.  Nmeros de bper  - Dr. Kowalski: [phone]  - Dra. Moye: [phone]  - Dra. Stewart: [phone]  En caso de inclemencias del tiempo, por favor llame a nuestra lnea principal al [phone] para una actualizacin sobre el estado de cualquier retraso o cierre.  Consejos para la medicacin en dermatologa: Por favor, guarde las cajas en las que vienen los medicamentos de uso tpico para ayudarle a seguir las instrucciones sobre dnde y cmo usarlos. Las farmacias generalmente imprimen las instrucciones del medicamento slo en las cajas y no directamente en los tubos del medicamento.   Si su medicamento es muy caro, por favor, pngase en contacto con nuestra oficina llamando al [phone] y presione la opcin 4 o envenos un mensaje a travs de MyChart.   No podemos decirle cul ser su copago por los medicamentos por adelantado ya que esto es diferente dependiendo de la cobertura de su seguro. Sin embargo, es posible que podamos encontrar un medicamento sustituto a menor costo o llenar un formulario para que el  seguro cubra el medicamento que se considera necesario.   Si se requiere una autorizacin previa para que su compaa de seguros cubra su medicamento, por favor permtanos de 1 a 2 das hbiles para completar este proceso.  Los precios de los medicamentos varan con frecuencia dependiendo del lugar de dnde se surte la receta y alguna farmacias pueden ofrecer precios ms baratos.  El sitio web www.goodrx.com tiene cupones para medicamentos de diferentes farmacias. Los precios aqu no tienen en cuenta lo que podra costar con la ayuda del seguro (puede ser ms barato con su seguro), pero el sitio web puede darle el precio si no utiliz ningn seguro.  - Puede imprimir el cupn correspondiente y llevarlo con su receta a la farmacia.  - Tambin puede pasar por nuestra oficina durante el horario de atencin regular y recoger una tarjeta de cupones de GoodRx.  - Si necesita que su receta se enve electrnicamente a una farmacia diferente, informe a nuestra oficina a travs de MyChart de [place]   o por telfono llamando al [phone] y presione la opcin 4.  

## 2022-01-29 ENCOUNTER — Encounter: Payer: Self-pay | Admitting: Dermatology

## 2022-02-22 ENCOUNTER — Other Ambulatory Visit: Payer: Self-pay | Admitting: Nurse Practitioner

## 2022-02-22 DIAGNOSIS — R1011 Right upper quadrant pain: Secondary | ICD-10-CM

## 2022-03-02 ENCOUNTER — Ambulatory Visit
Admission: RE | Admit: 2022-03-02 | Discharge: 2022-03-02 | Disposition: A | Discharge status: Home / Self Care | Payer: BC Managed Care – PPO | Source: Ambulatory Visit | Attending: Nurse Practitioner | Admitting: Nurse Practitioner

## 2022-03-02 DIAGNOSIS — R1011 Right upper quadrant pain: Secondary | ICD-10-CM | POA: Diagnosis present

## 2022-04-11 ENCOUNTER — Other Ambulatory Visit: Payer: Self-pay | Admitting: Oncology

## 2022-04-14 ENCOUNTER — Other Ambulatory Visit: Payer: Self-pay | Admitting: Nurse Practitioner

## 2022-04-14 DIAGNOSIS — R1011 Right upper quadrant pain: Secondary | ICD-10-CM

## 2022-04-14 DIAGNOSIS — R1013 Epigastric pain: Secondary | ICD-10-CM

## 2022-04-25 ENCOUNTER — Encounter
Admission: RE | Admit: 2022-04-25 | Discharge: 2022-04-25 | Disposition: A | Discharge status: Home / Self Care | Payer: BC Managed Care – PPO | Source: Ambulatory Visit | Attending: Nurse Practitioner | Admitting: Nurse Practitioner

## 2022-04-25 DIAGNOSIS — R1013 Epigastric pain: Secondary | ICD-10-CM | POA: Insufficient documentation

## 2022-04-25 DIAGNOSIS — R1011 Right upper quadrant pain: Secondary | ICD-10-CM | POA: Insufficient documentation

## 2022-04-25 MED ORDER — TECHNETIUM TC 99M MEBROFENIN IV KIT
5.1400 | PACK | Freq: Once | INTRAVENOUS | Status: AC | PRN
  Administered 2022-04-25: 5.14 via INTRAVENOUS

## 2022-05-29 ENCOUNTER — Other Ambulatory Visit: Payer: Self-pay

## 2022-05-29 ENCOUNTER — Encounter: Payer: Self-pay | Admitting: Medical Oncology

## 2022-05-29 ENCOUNTER — Inpatient Hospital Stay: Payer: BC Managed Care – PPO

## 2022-05-29 ENCOUNTER — Inpatient Hospital Stay: Payer: BC Managed Care – PPO | Attending: Oncology | Admitting: Medical Oncology

## 2022-05-29 ENCOUNTER — Ambulatory Visit: Payer: BC Managed Care – PPO | Admitting: Medical Oncology

## 2022-05-29 ENCOUNTER — Inpatient Hospital Stay: Payer: BC Managed Care – PPO | Admitting: Medical Oncology

## 2022-05-29 VITALS — BP 127/91 | HR 80 | Temp 98.1°F | Resp 18 | Wt 127.0 lb

## 2022-05-29 DIAGNOSIS — C50911 Malignant neoplasm of unspecified site of right female breast: Secondary | ICD-10-CM

## 2022-05-29 DIAGNOSIS — K760 Fatty (change of) liver, not elsewhere classified: Secondary | ICD-10-CM

## 2022-05-29 DIAGNOSIS — F419 Anxiety disorder, unspecified: Secondary | ICD-10-CM | POA: Diagnosis not present

## 2022-05-29 DIAGNOSIS — R5383 Other fatigue: Secondary | ICD-10-CM

## 2022-05-29 DIAGNOSIS — Z923 Personal history of irradiation: Secondary | ICD-10-CM | POA: Insufficient documentation

## 2022-05-29 DIAGNOSIS — Z170 Estrogen receptor positive status [ER+]: Secondary | ICD-10-CM | POA: Diagnosis present

## 2022-05-29 DIAGNOSIS — G8929 Other chronic pain: Secondary | ICD-10-CM

## 2022-05-29 DIAGNOSIS — R109 Unspecified abdominal pain: Secondary | ICD-10-CM | POA: Diagnosis not present

## 2022-05-29 DIAGNOSIS — Z79899 Other long term (current) drug therapy: Secondary | ICD-10-CM | POA: Insufficient documentation

## 2022-05-29 DIAGNOSIS — F32A Depression, unspecified: Secondary | ICD-10-CM | POA: Diagnosis not present

## 2022-05-29 DIAGNOSIS — C50412 Malignant neoplasm of upper-outer quadrant of left female breast: Secondary | ICD-10-CM | POA: Insufficient documentation

## 2022-05-29 DIAGNOSIS — Z79811 Long term (current) use of aromatase inhibitors: Secondary | ICD-10-CM | POA: Diagnosis not present

## 2022-05-29 DIAGNOSIS — R1900 Intra-abdominal and pelvic swelling, mass and lump, unspecified site: Secondary | ICD-10-CM | POA: Insufficient documentation

## 2022-05-29 DIAGNOSIS — C773 Secondary and unspecified malignant neoplasm of axilla and upper limb lymph nodes: Secondary | ICD-10-CM

## 2022-05-29 DIAGNOSIS — Z90710 Acquired absence of both cervix and uterus: Secondary | ICD-10-CM | POA: Insufficient documentation

## 2022-05-29 LAB — CBC WITH DIFFERENTIAL/PLATELET
Abs Immature Granulocytes: 0.01 10*3/uL (ref 0.00–0.07)
Basophils Absolute: 0 10*3/uL (ref 0.0–0.1)
Basophils Relative: 0 %
Eosinophils Absolute: 0.1 10*3/uL (ref 0.0–0.5)
Eosinophils Relative: 1 %
HCT: 41.5 % (ref 36.0–46.0)
Hemoglobin: 13.9 g/dL (ref 12.0–15.0)
Immature Granulocytes: 0 %
Lymphocytes Relative: 25 %
Lymphs Abs: 1.4 10*3/uL (ref 0.7–4.0)
MCH: 30.8 pg (ref 26.0–34.0)
MCHC: 33.5 g/dL (ref 30.0–36.0)
MCV: 91.8 fL (ref 80.0–100.0)
Monocytes Absolute: 0.3 10*3/uL (ref 0.1–1.0)
Monocytes Relative: 6 %
Neutro Abs: 3.7 10*3/uL (ref 1.7–7.7)
Neutrophils Relative %: 68 %
Platelets: 281 10*3/uL (ref 150–400)
RBC: 4.52 MIL/uL (ref 3.87–5.11)
RDW: 12.1 % (ref 11.5–15.5)
WBC: 5.5 10*3/uL (ref 4.0–10.5)
nRBC: 0 % (ref 0.0–0.2)

## 2022-05-29 LAB — COMPREHENSIVE METABOLIC PANEL WITH GFR
ALT: 15 U/L (ref 0–44)
AST: 22 U/L (ref 15–41)
Albumin: 4.4 g/dL (ref 3.5–5.0)
Alkaline Phosphatase: 63 U/L (ref 38–126)
Anion gap: 7 (ref 5–15)
BUN: 13 mg/dL (ref 8–23)
CO2: 25 mmol/L (ref 22–32)
Calcium: 9.1 mg/dL (ref 8.9–10.3)
Chloride: 104 mmol/L (ref 98–111)
Creatinine, Ser: 0.87 mg/dL (ref 0.44–1.00)
GFR, Estimated: >60 mL/min
Glucose, Bld: 97 mg/dL (ref 70–99)
Potassium: 3.6 mmol/L (ref 3.5–5.1)
Sodium: 136 mmol/L (ref 135–145)
Total Bilirubin: 0.7 mg/dL (ref 0.3–1.2)
Total Protein: 7.7 g/dL (ref 6.5–8.1)

## 2022-05-29 LAB — LIPASE, BLOOD: Lipase: 33 U/L (ref 11–51)

## 2022-05-29 LAB — C-REACTIVE PROTEIN: CRP: 0.6 mg/dL

## 2022-05-29 LAB — SEDIMENTATION RATE: Sed Rate: 12 mm/hr (ref 0–30)

## 2022-05-29 LAB — VITAMIN D 25 HYDROXY (VIT D DEFICIENCY, FRACTURES): Vit D, 25-Hydroxy: 60.1 ng/mL (ref 30–100)

## 2022-05-29 LAB — AMYLASE: Amylase: 62 U/L (ref 28–100)

## 2022-05-29 NOTE — Progress Notes (Signed)
Patient states that she has mild nausea, leg cramps, back pain, abdominal pain, "lymph node pain", occasional diarrhea, occasional dark stool.

## 2022-05-29 NOTE — Progress Notes (Signed)
Symptom Management [place] at [facility] [phone] [phone]  Patient Care Team: Benjaman Kindler, MD as PCP - General (Obstetrics and Gynecology) Sindy Guadeloupe, MD as Consulting Physician (Hematology and Oncology)   Name of the patient: Tabitha Tucker  [id number]  [birth year]   Date of visit: 05/29/22  Diagnosis-stage I ER positive breast cancer on Arimidex   Chief complaint/ Reason for visit-routine follow-up of breast cancer on Arimidex   Heme/Onc history: patient is a 61 year old Caucasian female who recently underwent a screening mammogram which showed 1.7 x 1.2 x 1.6 cm hypoechoic irregular area in her left breast at 1 o'clock position.  No evidence of left axillary adenopathy.  This was 5 biopsied and was invasive mammary carcinoma grade 2, ER greater than 90% positive PR 11 to 50% positive and HER-2/neu negative.  Patient will be seeing surgery tomorrow.  She has never had prior breast biopsies.  She is G2 P2 L2.  She has had a hysterectomy many years ago.  No significant family history of breast cancer, ovarian cancer, colon gastric or pancreatic cancer.    Patient underwent lumpectomy with sentinel lymph node biopsy on 07/25/2018.  Final pathology showed invasive mammary carcinoma 1.7 cm, grade 2 with negative margins.  2 out of 2 sentinel lymph nodes were positive for malignancy.  One lymph node was involved by micrometastatic carcinoma 1.5 mm and the other lymph node was involved with macro metastatic disease 5 mm.  There was focal microscopy extracapsular extension present which I verified with Dr. Reuel Derby from pathology.  pT1c pN1A.  Systemic CT scans denied by insurance.  She completed adjuvant radiation treatment   Patient underwent MammaPrint testing which came back as low risk with no significant benefit from adjuvant chemotherapy.  Patient started taking Arimidex in May 2020.  Interval History:   Patient states that  overall she feels like she is "falling apart" She states that since around March 2023 she has noticed upper abdominal pain, fatigue, muscle cramps, occasional dark stools, joint pains and a general feeling of being unwell. She stopped drinking alcohol after a CT in March showed hepatic steatosis. Since then she has lost weight (about 20 pounds). Depression is improved per patient. Anxiety itself is stable but she reports that her continued abdominal pain has made her anxious. She has been seen by GI as well as her PCP. She acknowledges that they have tried to find the cause but now she does not feel heard as they keep pushing an antidepressant medication. She is concerned about possible pancreatic, liver or kidney cancer given her pain is in her upper back that wraps around. She has had a colonoscopy, endoscopy, HIDA scan and the original CT back in March. She reports that her pain is worse and she is desperate to get further work up. She reports that if repeat imaging and labs are normal she is willing to trial the zoloft given to her by the other providers.   In terms of her breast cancer monitoring she is tolerating the Arimidex well. She denies any new lumps, bumps or discharge of her breast but does mention feeling soreness of her lymph nodes throughout her body from time to time. Not currently active today.   Denies any neurologic complaints. Denies recent fevers or illnesses. Denies any easy bleeding or bruising. Reports good appetite. Denies chest pain. Denies any nausea, vomiting, constipation, or diarrhea. Denies urinary complaints. Patient offers no further specific complaints today.  Wt Readings  from Last 3 Encounters:  05/29/22 127 lb (57.6 kg)  06/15/21 139 lb 8 oz (63.3 kg)  11/17/20 143 lb 3.2 oz (65 kg)     PAST MEDICAL HISTORY: Past Medical History:  Diagnosis Date   Breast cancer ([place]) 07/2018   High cholesterol    Panic attacks    Personal history of radiation therapy    PONV  (postoperative nausea and vomiting)     PAST SURGICAL HISTORY:  Past Surgical History:  Procedure Laterality Date   ABDOMINAL HYSTERECTOMY     pt did keep ovaries   APPENDECTOMY     ate age 61   BREAST BIOPSY Left 07/10/2018   INVASIVE MAMMARY CARCINOMA   BREAST LUMPECTOMY Left 07/25/2018   [place],    BREAST LUMPECTOMY WITH NEEDLE LOCALIZATION AND AXILLARY SENTINEL LYMPH NODE BX Left 07/25/2018   Procedure: BREAST LUMPECTOMY WITH NEEDLE LOCALIZATION AND SENTINEL NODE BX;  Surgeon: Benjamine Sprague, DO;  Location: [facility] ORS;  Service: General;  Laterality: Left;    HEMATOLOGY/ONCOLOGY HISTORY:  Oncology History  Malignant neoplasm of left female breast ([place])  07/16/2018 Cancer Staging   Staging form: Breast, AJCC 8th Edition - Clinical stage from 07/16/2018: Stage IA (cT1c, cN0, cM0, G2, ER+, PR+, HER2-) - Signed by Sindy Guadeloupe, MD on 07/18/2018   07/18/2018 Initial Diagnosis   Malignant neoplasm of left female breast ([place])   08/01/2018 Cancer Staging   Staging form: Breast, AJCC 8th Edition - Pathologic: Stage IA (pT1c, pN1a, cM0, G2, ER+, PR+, HER2-) - Signed by Sindy Guadeloupe, MD on 08/01/2018     ALLERGIES:  has No Known Allergies.  MEDICATIONS:  Current Outpatient Medications  Medication Sig Dispense Refill   anastrozole (ARIMIDEX) 1 MG tablet TAKE 1 TABLET BY MOUTH DAILY 90 tablet 2   B Complex-C (SUPER B COMPLEX PO) Take 1 tablet by mouth daily.     Calcium Carbonate-Vit D-Min (CALCIUM 600+D PLUS MINERALS) 600-400 MG-UNIT TABS Take 2 tablets by mouth daily.     Garlic 5035 MG CAPS Take 1,000 mg by mouth daily.     pantoprazole (PROTONIX) 40 MG tablet Take by mouth.     simvastatin (ZOCOR) 10 MG tablet Take 10 mg by mouth daily.     traZODone (DESYREL) 50 MG tablet Take 50-100 mg by mouth at bedtime as needed.     ALPRAZolam (XANAX) 0.25 MG tablet Take 0.25 mg by mouth 2 (two) times daily as needed for anxiety. (Patient not taking: Reported on 11/25/2021)     No current  facility-administered medications for this visit.    VITAL SIGNS: BP (!) 127/91 (BP Location: Left Arm, Patient Position: Sitting)   Pulse 80   Temp 98.1 F (36.7 C) (Tympanic)   Resp 18   Wt 127 lb (57.6 kg)   BMI 21.80 kg/m  Filed Weights   05/29/22 1110  Weight: 127 lb (57.6 kg)    Estimated body mass index is 21.8 kg/m as calculated from the following:   Height as of 06/15/21: 5' 4" (1.626 m).   Weight as of this encounter: 127 lb (57.6 kg).  LABS: CBC:    Component Value Date/Time   WBC 5.2 12/08/2019 1157   HGB 13.9 12/08/2019 1157   HGB 15.1 09/10/2013 0932   HCT 40.9 12/08/2019 1157   HCT 44.3 09/10/2013 0932   PLT 270 12/08/2019 1157   PLT 282 09/10/2013 0932   MCV 96.2 12/08/2019 1157   MCV 98 09/10/2013 0932   NEUTROABS 3.7 12/08/2019 1157  LYMPHSABS 1.0 12/08/2019 1157   MONOABS 0.4 12/08/2019 1157   EOSABS 0.1 12/08/2019 1157   BASOSABS 0.0 12/08/2019 1157   Comprehensive Metabolic Panel:    Component Value Date/Time   NA 139 06/14/2020 1506   NA 139 09/10/2013 0932   K 4.3 06/14/2020 1506   K 4.1 09/10/2013 0932   CL 101 06/14/2020 1506   CL 106 09/10/2013 0932   CO2 28 06/14/2020 1506   CO2 28 09/10/2013 0932   BUN 15 06/14/2020 1506   BUN 11 09/10/2013 0932   CREATININE 0.86 06/14/2020 1506   CREATININE 0.85 09/10/2013 0932   GLUCOSE 100 (H) 06/14/2020 1506   GLUCOSE 87 09/10/2013 0932   CALCIUM 9.9 06/14/2020 1506   CALCIUM 9.9 09/10/2013 0932   AST 19 06/14/2020 1506   AST 24 09/10/2013 0932   ALT 15 06/14/2020 1506   ALT 18 09/10/2013 0932   ALKPHOS 61 06/14/2020 1506   ALKPHOS 82 09/10/2013 0932   BILITOT 0.7 06/14/2020 1506   BILITOT 0.6 09/10/2013 0932   PROT 7.3 06/14/2020 1506   PROT 8.6 (H) 09/10/2013 0932   ALBUMIN 4.2 06/14/2020 1506   ALBUMIN 4.3 09/10/2013 0932    RADIOGRAPHIC STUDIES: No results found.  PERFORMANCE STATUS (ECOG) : 1 - Symptomatic but completely ambulatory  Review of Systems Unless otherwise  noted, a complete review of systems is negative.  Physical Exam General: NAD Psych: Speech is not pressured or tangential. She is well groomed and not overly anxious appearing. Thoughts are not abnormal.  Cardiovascular: regular rate and rhythm, no peripheral edema Pulmonary: clear ant fields Lymph: No enlargement of lymph nodes throughout Extremities: no edema, no joint deformities Breast: No axillary adenopathy, palpable masses, nipple discharge or breast tenderness to palpation Skin: no rashes Neurological: Weakness but otherwise nonfocal  Assessment and Plan- Patient is a 61 y.o. female    Encounter Diagnoses  Name Primary?   Chronic abdominal pain Yes   Other fatigue    Hepatic steatosis    Breast cancer metastasized to axillary lymph node, right (HCC)    Chronic abdominal Pain/Fatigue: Chronic but worsening. Has had work up by GI including endoscopy/colonoscopy which showed gastritis. PPI not helping per notes and patient. Has had recent hepatobiliary imaging with gallbladder on 04/25/2022 which showed normal gallbladder function. Has had recent TSH, ferritin, B12 testing within the last year. Very well could be psychosomatic however patient is clearly distressed and I want to respect her concerns especially given her history. For this reason I have recommended additional labs and imaging given the worsening progression of her symptoms despite multiple medications. For now I have asked her to trial the zoloft as suggested by GI while we awaiting lab and imaging results. She is agreeable.   Hepatic Steatosis: Happy to hear she has stopped her ETOH use. GI to continue to monitor  Breast Cancer: On Aromasin. Is UTD on her mammogram which was Bi-RADS category 1. Repeat in May. Not likely that the Aromasin is contributing to her symptoms but if labs/imaging is benign and SSRI/SNRI are not beneficial a 2 week trial off of the Aromasin may be considered   Patient expressed understanding  and was in agreement with this plan. She also understands that She can call clinic at any time with any questions, concerns, or complaints.   Thank you for allowing me to participate in the care of this very pleasant patient.   Time Total: 25  Visit consisted of counseling and education dealing with the  complex and emotionally intense issues of symptom management in the setting of serious illness.Greater than 50%  of this time was spent counseling and coordinating care related to the above assessment and plan.  Signed by: Nelwyn Salisbury, PA-C

## 2022-05-30 LAB — LYME DISEASE SEROLOGY W/REFLEX: Lyme Total Antibody EIA: NEGATIVE

## 2022-06-02 ENCOUNTER — Ambulatory Visit: Payer: BC Managed Care – PPO | Admitting: Medical Oncology

## 2022-06-02 LAB — ALPHA GALACTOSIDASE: Alpha galactosidase, serum: 72.2 nmol/hr/mg prt (ref >35.5)

## 2022-06-06 ENCOUNTER — Ambulatory Visit
Admission: RE | Admit: 2022-06-06 | Discharge: 2022-06-06 | Disposition: A | Discharge status: Home / Self Care | Payer: BC Managed Care – PPO | Source: Ambulatory Visit | Attending: Medical Oncology | Admitting: Medical Oncology

## 2022-06-06 DIAGNOSIS — K760 Fatty (change of) liver, not elsewhere classified: Secondary | ICD-10-CM | POA: Diagnosis present

## 2022-06-06 DIAGNOSIS — R109 Unspecified abdominal pain: Secondary | ICD-10-CM | POA: Insufficient documentation

## 2022-06-06 DIAGNOSIS — G8929 Other chronic pain: Secondary | ICD-10-CM | POA: Diagnosis present

## 2022-06-06 DIAGNOSIS — C773 Secondary and unspecified malignant neoplasm of axilla and upper limb lymph nodes: Secondary | ICD-10-CM | POA: Diagnosis present

## 2022-06-06 DIAGNOSIS — C50911 Malignant neoplasm of unspecified site of right female breast: Secondary | ICD-10-CM | POA: Insufficient documentation

## 2022-06-06 MED ORDER — IOHEXOL 300 MG/ML  SOLN
80.0000 mL | Freq: Once | INTRAMUSCULAR | Status: AC | PRN
  Administered 2022-06-06: 80 mL via INTRAVENOUS

## 2022-11-28 ENCOUNTER — Encounter: Payer: Self-pay | Admitting: Oncology

## 2022-11-28 ENCOUNTER — Inpatient Hospital Stay: Payer: BC Managed Care – PPO | Attending: Oncology | Admitting: Oncology

## 2022-11-28 VITALS — BP 132/84 | HR 81 | Temp 97.6°F | Resp 18 | Ht 64.0 in | Wt 128.5 lb

## 2022-11-28 DIAGNOSIS — Z170 Estrogen receptor positive status [ER+]: Secondary | ICD-10-CM | POA: Insufficient documentation

## 2022-11-28 DIAGNOSIS — C50412 Malignant neoplasm of upper-outer quadrant of left female breast: Secondary | ICD-10-CM | POA: Diagnosis present

## 2022-11-28 DIAGNOSIS — Z13820 Encounter for screening for osteoporosis: Secondary | ICD-10-CM | POA: Diagnosis not present

## 2022-11-28 DIAGNOSIS — Z08 Encounter for follow-up examination after completed treatment for malignant neoplasm: Secondary | ICD-10-CM | POA: Diagnosis not present

## 2022-11-28 DIAGNOSIS — M8580 Other specified disorders of bone density and structure, unspecified site: Secondary | ICD-10-CM | POA: Insufficient documentation

## 2022-11-28 DIAGNOSIS — Z79811 Long term (current) use of aromatase inhibitors: Secondary | ICD-10-CM | POA: Diagnosis not present

## 2022-11-28 DIAGNOSIS — Z853 Personal history of malignant neoplasm of breast: Secondary | ICD-10-CM

## 2022-11-28 DIAGNOSIS — C773 Secondary and unspecified malignant neoplasm of axilla and upper limb lymph nodes: Secondary | ICD-10-CM | POA: Insufficient documentation

## 2022-11-28 NOTE — Progress Notes (Signed)
Hematology/Oncology Consult note [facility]  Telephone:([phone] [phone]  Patient Care Team: Christeen Douglas, MD as PCP - General (Obstetrics and Gynecology) Creig Hines, MD as Consulting Physician (Hematology and Oncology)   Name of the patient: Tabitha Tucker  [id number]  Mar 14, 1961   Date of visit: 11/28/22  Diagnosis- stage I ER positive breast cancer on Arimidex   Chief complaint/ Reason for visit-routine follow-up of breast cancer  Heme/Onc history: patient is a 62 year old Caucasian female who underwent a screening mammogram which showed 1.7 x 1.2 x 1.6 cm hypoechoic irregular area in her left breast at 1 o'clock position.  No evidence of left axillary adenopathy.  This was 5 biopsied and was invasive mammary carcinoma grade 2, ER greater than 90% positive PR 11 to 50% positive and HER-2/neu negative.  Patient will be seeing surgery tomorrow.  She has never had prior breast biopsies.  She is G2 P2 L2.  She has had a hysterectomy many years ago.  No significant family history of breast cancer, ovarian cancer, colon gastric or pancreatic cancer.    Patient underwent lumpectomy with sentinel lymph node biopsy on 07/25/2018.  Final pathology showed invasive mammary carcinoma 1.7 cm, grade 2 with negative margins.  2 out of 2 sentinel lymph nodes were positive for malignancy.  One lymph node was involved by micrometastatic carcinoma 1.5 mm and the other lymph node was involved with macro metastatic disease 5 mm.  There was focal microscopy extracapsular extension present which I verified with Dr. Oneita Kras from pathology.  pT1c pN1A.  Systemic CT scans denied by insurance.  She completed adjuvant radiation treatment   Patient underwent MammaPrint testing which came back as low risk with no significant benefit from adjuvant chemotherapy.  Patient started taking Arimidex in May 2020.  Interval history-tolerating Arimidex well without any significant side  effects.  Occasional tenderness at the lumpectomy site.  Denies any changes in her appetite or weight.  ECOG PS- 0 Pain scale- 0   Review of systems- Review of Systems  Constitutional:  Negative for chills, fever, malaise/fatigue and weight loss.  HENT:  Negative for congestion, ear discharge and nosebleeds.   Eyes:  Negative for blurred vision.  Respiratory:  Negative for cough, hemoptysis, sputum production, shortness of breath and wheezing.   Cardiovascular:  Negative for chest pain, palpitations, orthopnea and claudication.  Gastrointestinal:  Negative for abdominal pain, blood in stool, constipation, diarrhea, heartburn, melena, nausea and vomiting.  Genitourinary:  Negative for dysuria, flank pain, frequency, hematuria and urgency.  Musculoskeletal:  Negative for back pain, joint pain and myalgias.  Skin:  Negative for rash.  Neurological:  Negative for dizziness, tingling, focal weakness, seizures, weakness and headaches.  Endo/Heme/Allergies:  Does not bruise/bleed easily.  Psychiatric/Behavioral:  Negative for depression and suicidal ideas. The patient does not have insomnia.       No Known Allergies   Past Medical History:  Diagnosis Date   Breast cancer (HCC) 07/2018   High cholesterol    Panic attacks    Personal history of radiation therapy    PONV (postoperative nausea and vomiting)      Past Surgical History:  Procedure Laterality Date   ABDOMINAL HYSTERECTOMY     pt did keep ovaries   APPENDECTOMY     ate age 56   BREAST BIOPSY Left 07/10/2018   INVASIVE MAMMARY CARCINOMA   BREAST LUMPECTOMY Left 07/25/2018   [facility],    BREAST LUMPECTOMY WITH NEEDLE LOCALIZATION AND AXILLARY  SENTINEL LYMPH NODE BX Left 07/25/2018   Procedure: BREAST LUMPECTOMY WITH NEEDLE LOCALIZATION AND SENTINEL NODE BX;  Surgeon: Sung Amabile, DO;  Location: [facility] ORS;  Service: General;  Laterality: Left;    Social History   Socioeconomic History   Marital status: Married    Spouse  name: Not on file   Number of children: Not on file   Years of education: Not on file   Highest education level: Not on file  Occupational History   Not on file  Tobacco Use   Smoking status: Never   Smokeless tobacco: Never  Vaping Use   Vaping Use: Never used  Substance and Sexual Activity   Alcohol use: Yes    Alcohol/week: 14.0 standard drinks of alcohol    Types: 14 Glasses of wine per week    Comment: 2 glasses of wine every day   Drug use: Not Currently    Types: Marijuana    Comment: marijuana when she was a teenager a few time   Sexual activity: Yes  Other Topics Concern   Not on file  Social History Narrative   Not on file   Social Determinants of Health   Financial Resource Strain: Not on file  Food Insecurity: Not on file  Transportation Needs: Not on file  Physical Activity: Not on file  Stress: Not on file  Social Connections: Not on file  Intimate Partner Violence: Not on file    Family History  Problem Relation Age of Onset   Hypertension Father    Breast cancer Neg Hx      Current Outpatient Medications:    amitriptyline (ELAVIL) 10 MG tablet, Take 10 mg by mouth at bedtime., Disp: , Rfl:    anastrozole (ARIMIDEX) 1 MG tablet, TAKE 1 TABLET BY MOUTH DAILY, Disp: 90 tablet, Rfl: 2   B Complex-C (SUPER B COMPLEX PO), Take 1 tablet by mouth daily., Disp: , Rfl:    Calcium Carbonate-Vit D-Min (CALCIUM 600+D PLUS MINERALS) 600-400 MG-UNIT TABS, Take 2 tablets by mouth daily., Disp: , Rfl:    Garlic 1000 MG CAPS, Take 1,000 mg by mouth daily., Disp: , Rfl:    simvastatin (ZOCOR) 10 MG tablet, Take 10 mg by mouth daily., Disp: , Rfl:    ALPRAZolam (XANAX) 0.25 MG tablet, Take 0.25 mg by mouth 2 (two) times daily as needed for anxiety. (Patient not taking: Reported on 11/25/2021), Disp: , Rfl:    pantoprazole (PROTONIX) 40 MG tablet, Take by mouth., Disp: , Rfl:   Physical exam:  Vitals:   11/28/22 1011 11/28/22 1015  BP: (!) 150/91 132/84  Pulse: 82  81  Resp: 18   Temp: 97.6 F (36.4 C)   TempSrc: Tympanic   SpO2: 100%   Weight: 128 lb 8 oz (58.3 kg)   Height: 5\' 4"  (1.626 m)    Physical Exam Cardiovascular:     Rate and Rhythm: Normal rate and regular rhythm.     Heart sounds: Normal heart sounds.  Pulmonary:     Effort: Pulmonary effort is normal.     Breath sounds: Normal breath sounds.  Abdominal:     General: Bowel sounds are normal.     Palpations: Abdomen is soft.  Skin:    General: Skin is warm and dry.  Neurological:     Mental Status: She is alert and oriented to person, place, and time.    Breast exam was performed in seated and lying down position. Patient is status post left lumpectomy with  a well-healed surgical scar. No evidence of any palpable masses. No evidence of axillary adenopathy. No evidence of any palpable masses or lumps in the right breast. No evidence of right axillary adenopathy      Latest Ref Rng & Units 05/29/2022   12:00 PM  CMP  Glucose 70 - 99 mg/dL 97   BUN 8 - 23 mg/dL 13   Creatinine 1.61 - 1.00 mg/dL 0.96   Sodium 045 - 409 mmol/L 136   Potassium 3.5 - 5.1 mmol/L 3.6   Chloride 98 - 111 mmol/L 104   CO2 22 - 32 mmol/L 25   Calcium 8.9 - 10.3 mg/dL 9.1   Total Protein 6.5 - 8.1 g/dL 7.7   Total Bilirubin 0.3 - 1.2 mg/dL 0.7   Alkaline Phos 38 - 126 U/L 63   AST 15 - 41 U/L 22   ALT 0 - 44 U/L 15       Latest Ref Rng & Units 05/29/2022   12:34 PM  CBC  WBC 4.0 - 10.5 K/uL 5.5   Hemoglobin 12.0 - 15.0 g/dL 81.1   Hematocrit 91.4 - 46.0 % 41.5   Platelets 150 - 400 K/uL 281     No images are attached to the encounter.  No results found.   Assessment and plan- Patient is a 62 y.o. female with pathological prognostic stage Ia invasive mammary carcinoma of the left breast ER/PR positive HER-2 negative s/p lumpectomy adjuvant radiation therapy and currently on Arimidex.  This is a routine follow-up visit for breast cancer  Clinically patient is doing well with no  concerning signs and symptoms of recurrence based on today's exam.  She will continue taking Arimidex for 1 more year ending in May 2025.  She is scheduled to undergo mammogram next month.  I will see her back in 6 months no labs.  She had a bone density scan in 2021 and we we will be repeating 1 this year.  Baseline bone density scan was normal   Visit Diagnosis 1. Breast cancer metastasized to axillary lymph node, right (HCC)   2. Malignant neoplasm of upper-outer quadrant of left breast in female, estrogen receptor positive (HCC)   3. Aromatase inhibitor use   4. Osteopenia after menopause      Dr. Owens Shark, MD, MPH [facility] [phone] 11/28/2022 12:51 PM

## 2022-12-11 ENCOUNTER — Ambulatory Visit
Admission: RE | Admit: 2022-12-11 | Discharge: 2022-12-11 | Disposition: A | Discharge status: Home / Self Care | Payer: BC Managed Care – PPO | Source: Ambulatory Visit | Attending: Medical Oncology | Admitting: Medical Oncology

## 2022-12-11 DIAGNOSIS — C50412 Malignant neoplasm of upper-outer quadrant of left female breast: Secondary | ICD-10-CM

## 2022-12-11 DIAGNOSIS — C50911 Malignant neoplasm of unspecified site of right female breast: Secondary | ICD-10-CM

## 2022-12-11 DIAGNOSIS — Z1231 Encounter for screening mammogram for malignant neoplasm of breast: Secondary | ICD-10-CM | POA: Insufficient documentation

## 2023-01-03 ENCOUNTER — Other Ambulatory Visit: Payer: Self-pay | Admitting: Oncology

## 2023-01-11 ENCOUNTER — Ambulatory Visit: Payer: BC Managed Care – PPO | Admitting: Dermatology

## 2023-01-11 VITALS — BP 124/74

## 2023-01-11 DIAGNOSIS — L219 Seborrheic dermatitis, unspecified: Secondary | ICD-10-CM | POA: Diagnosis not present

## 2023-01-11 DIAGNOSIS — L814 Other melanin hyperpigmentation: Secondary | ICD-10-CM

## 2023-01-11 DIAGNOSIS — L578 Other skin changes due to chronic exposure to nonionizing radiation: Secondary | ICD-10-CM

## 2023-01-11 DIAGNOSIS — Z7189 Other specified counseling: Secondary | ICD-10-CM

## 2023-01-11 DIAGNOSIS — D239 Other benign neoplasm of skin, unspecified: Secondary | ICD-10-CM

## 2023-01-11 DIAGNOSIS — L821 Other seborrheic keratosis: Secondary | ICD-10-CM

## 2023-01-11 DIAGNOSIS — Z1283 Encounter for screening for malignant neoplasm of skin: Secondary | ICD-10-CM

## 2023-01-11 DIAGNOSIS — D220 Melanocytic nevi of lip: Secondary | ICD-10-CM

## 2023-01-11 DIAGNOSIS — D225 Melanocytic nevi of trunk: Secondary | ICD-10-CM

## 2023-01-11 DIAGNOSIS — D492 Neoplasm of unspecified behavior of bone, soft tissue, and skin: Secondary | ICD-10-CM

## 2023-01-11 DIAGNOSIS — Z79899 Other long term (current) drug therapy: Secondary | ICD-10-CM

## 2023-01-11 DIAGNOSIS — W908XXA Exposure to other nonionizing radiation, initial encounter: Secondary | ICD-10-CM

## 2023-01-11 DIAGNOSIS — D229 Melanocytic nevi, unspecified: Secondary | ICD-10-CM

## 2023-01-11 HISTORY — DX: Other benign neoplasm of skin, unspecified: D23.9

## 2023-01-11 MED ORDER — FLUOCINOLONE ACETONIDE 0.01 % OT OIL: 1.0000 | TOPICAL_OIL | OTIC | 4 refills | Status: DC

## 2023-01-11 NOTE — Progress Notes (Signed)
New Patient Visit   Subjective  Tabitha Tucker is a 62 y.o. female who presents for the following: Skin Cancer Screening and Full Body Skin Exam, itching in ears, months, no treatment  The patient presents for Total-Body Skin Exam (TBSE) for skin cancer screening and mole check. The patient has spots, moles and lesions to be evaluated, some may be new or changing and the patient may have concern these could be cancer.    The following portions of the chart were reviewed this encounter and updated as appropriate: medications, allergies, medical history  Review of Systems:  No other skin or systemic complaints except as noted in HPI or Assessment and Plan.  Objective  Well appearing patient in no apparent distress; mood and affect are within normal limits.  A full examination was performed including scalp, head, eyes, ears, nose, lips, neck, chest, axillae, abdomen, back, buttocks, bilateral upper extremities, bilateral lower extremities, hands, feet, fingers, toes, fingernails, and toenails. All findings within normal limits unless otherwise noted below.   Relevant physical exam findings are noted in the Assessment and Plan.  RUQA periumbilical Irregular dark brown macule 0.6cm         Assessment & Plan   SKIN CANCER SCREENING PERFORMED TODAY.  ACTINIC DAMAGE - Chronic condition, secondary to cumulative UV/sun exposure - diffuse scaly erythematous macules with underlying dyspigmentation - Recommend daily broad spectrum sunscreen SPF 30+ to sun-exposed areas, reapply every 2 hours as needed.  - Staying in the shade or wearing long sleeves, sun glasses (UVA+UVB protection) and wide brim hats (4-inch brim around the entire circumference of the hat) are also recommended for sun protection.  - Call for new or changing lesions.  LENTIGINES, SEBORRHEIC KERATOSES, HEMANGIOMAS - Benign normal skin lesions - Benign-appearing - Call for any changes  MELANOCYTIC NEVI - Tan-brown  and/or pink-flesh-colored symmetric macules and papules - Benign appearing on exam today - Observation - Call clinic for new or changing moles - Recommend daily use of broad spectrum spf 30+ sunscreen to sun-exposed areas.  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 Condition is bothersome/symptomatic for patient. Currently flared.   Seborrheic Dermatitis is a chronic persistent rash characterized by pinkness and scaling most commonly of the mid face but also can occur on the scalp (dandruff), ears; mid chest, mid back and groin.  It tends to be exacerbated by stress and cooler weather.  People who have neurologic disease may experience new onset or exacerbation of existing seborrheic dermatitis.  The condition is not curable but treatable and can be controlled.  Treatment Plan: Start Dermotic oil qd 5d/wk for 2 wk then 2-3d/wk prn flares  Topical steroids (such as triamcinolone, fluocinolone, fluocinonide, mometasone, clobetasol, halobetasol, betamethasone, hydrocortisone) can cause thinning and lightening of the skin if they are used for too long in the same area. Your physician has selected the right strength medicine for your problem and area affected on the body. Please use your medication only as directed by your physician to prevent side effects.    Long term medication management.  Patient is using long term (months to years) prescription medication  to control their dermatologic condition.  These medications require periodic monitoring to evaluate for efficacy and side effects and may require periodic laboratory monitoring.   Neoplasm of skin RUQA periumbilical  Epidermal / dermal shaving  Lesion  diameter (cm):  0.6 Informed consent: discussed and consent obtained   Timeout: patient name, date of birth, surgical site,  and procedure verified   Procedure prep:  Patient was prepped and draped in usual sterile fashion Prep type:  Isopropyl alcohol Anesthesia: the lesion was anesthetized in a standard fashion   Anesthetic:  1% lidocaine w/ epinephrine 1-100,000 buffered w/ 8.4% NaHCO3 Instrument used: flexible razor blade   Hemostasis achieved with: pressure, aluminum chloride and electrodesiccation   Outcome: patient tolerated procedure well   Post-procedure details: sterile dressing applied and wound care instructions given   Dressing type: bandage and petrolatum    Specimen 1 - Surgical pathology Differential Diagnosis: D48.5 Nevus vs Dysplastic Nevus  Check Margins: yes Irregular dark brown macule 0.6 cm    Return in about 1 year (around 01/11/2024) for TBSE.  I, Ardis Rowan, RMA, am acting as scribe for Armida Sans, MD .   Documentation: I have reviewed the above documentation for accuracy and completeness, and I agree with the above.  Armida Sans, MD

## 2023-01-11 NOTE — Patient Instructions (Addendum)
Wound Care Instructions  Cleanse wound gently with soap and water once a day then pat dry with clean gauze. Apply a thin coat of Petrolatum (petroleum jelly, "Vaseline") over the wound (unless you have an allergy to this). We recommend that you use a new, sterile tube of Vaseline. Do not pick or remove scabs. Do not remove the yellow or white "healing tissue" from the base of the wound.  Cover the wound with fresh, clean, nonstick gauze and secure with paper tape. You may use Band-Aids in place of gauze and tape if the wound is small enough, but would recommend trimming much of the tape off as there is often too much. Sometimes Band-Aids can irritate the skin.  You should call the office for your biopsy report after 1 week if you have not already been contacted.  If you experience any problems, such as abnormal amounts of bleeding, swelling, significant bruising, significant pain, or evidence of infection, please call the office immediately.  FOR ADULT SURGERY PATIENTS: If you need something for pain relief you may take 1 extra strength Tylenol (acetaminophen) AND 2 Ibuprofen (200mg each) together every 4 hours as needed for pain. (do not take these if you are allergic to them or if you have a reason you should not take them.) Typically, you may only need pain medication for 1 to 3 days.     Due to recent changes in healthcare laws, you may see results of your pathology and/or laboratory studies on MyChart before the doctors have had a chance to review them. We understand that in some cases there may be results that are confusing or concerning to you. Please understand that not all results are received at the same time and often the doctors may need to interpret multiple results in order to provide you with the best plan of care or course of treatment. Therefore, we ask that you please give us 2 business days to thoroughly review all your results before contacting the office for clarification. Should  we see a critical lab result, you will be contacted sooner.   If You Need Anything After Your Visit  If you have any questions or concerns for your doctor, please call our main line at [phone] and press option 4 to reach your doctor's medical assistant. If no one answers, please leave a voicemail as directed and we will return your call as soon as possible. Messages left after 4 pm will be answered the following business day.   You may also send us a message via MyChart. We typically respond to MyChart messages within 1-2 business days.  For prescription refills, please ask your pharmacy to contact our office. Our fax number is [phone].  If you have an urgent issue when the clinic is closed that cannot wait until the next business day, you can page your doctor at the number below.    Please note that while we do our best to be available for urgent issues outside of office hours, we are not available 24/7.   If you have an urgent issue and are unable to reach us, you may choose to seek medical care at your doctor's office, retail clinic, urgent care center, or emergency room.  If you have a medical emergency, please immediately call 911 or go to the emergency department.  Pager Numbers  - Dr. Kowalski: [phone]  - Dr. Moye: [phone]  - Dr. Stewart: [phone]  In the event of inclement weather, please call our main line at   [phone] for an update on the status of any delays or closures.  Dermatology Medication Tips: Please keep the boxes that topical medications come in in order to help keep track of the instructions about where and how to use these. Pharmacies typically print the medication instructions only on the boxes and not directly on the medication tubes.   If your medication is too expensive, please contact our office at [phone] option 4 or send us a message through MyChart.   We are unable to tell what your co-pay for medications will be in  advance as this is different depending on your insurance coverage. However, we may be able to find a substitute medication at lower cost or fill out paperwork to get insurance to cover a needed medication.   If a prior authorization is required to get your medication covered by your insurance company, please allow us 1-2 business days to complete this process.  Drug prices often vary depending on where the prescription is filled and some pharmacies may offer cheaper prices.  The website www.goodrx.com contains coupons for medications through different pharmacies. The prices here do not account for what the cost may be with help from insurance (it may be cheaper with your insurance), but the website can give you the price if you did not use any insurance.  - You can print the associated coupon and take it with your prescription to the pharmacy.  - You may also stop by our office during regular business hours and pick up a GoodRx coupon card.  - If you need your prescription sent electronically to a different pharmacy, notify our office through  MyChart or by phone at [phone] option 4.     Si Usted Necesita Algo Despus de Su Visita  Tambin puede enviarnos un mensaje a travs de MyChart. Por lo general respondemos a los mensajes de MyChart en el transcurso de 1 a 2 das hbiles.  Para renovar recetas, por favor pida a su farmacia que se ponga en contacto con nuestra oficina. Nuestro nmero de fax es el [phone].  Si tiene un asunto urgente cuando la clnica est cerrada y que no puede esperar hasta el siguiente da hbil, puede llamar/localizar a su doctor(a) al nmero que aparece a continuacin.   Por favor, tenga en cuenta que aunque hacemos todo lo posible para estar disponibles para asuntos urgentes fuera del horario de oficina, no estamos disponibles las 24 horas del da, los 7 das de la semana.   Si tiene un problema urgente y no puede comunicarse con nosotros, puede  optar por buscar atencin mdica  en el consultorio de su doctor(a), en una clnica privada, en un centro de atencin urgente o en una sala de emergencias.  Si tiene una emergencia mdica, por favor llame inmediatamente al 911 o vaya a la sala de emergencias.  Nmeros de bper  - Dr. Kowalski: [phone]  - Dra. Moye: [phone]  - Dra. Stewart: [phone]  En caso de inclemencias del tiempo, por favor llame a nuestra lnea principal al [phone] para una actualizacin sobre el estado de cualquier retraso o cierre.  Consejos para la medicacin en dermatologa: Por favor, guarde las cajas en las que vienen los medicamentos de uso tpico para ayudarle a seguir las instrucciones sobre dnde y cmo usarlos. Las farmacias generalmente imprimen las instrucciones del medicamento slo en las cajas y no directamente en los tubos del medicamento.   Si su medicamento es muy caro, por favor, pngase en contacto con   nuestra oficina llamando al [phone] y presione la opcin 4 o envenos un mensaje a travs de MyChart.   No podemos decirle cul ser su copago por los medicamentos por adelantado ya que esto es diferente dependiendo de la cobertura de su seguro. Sin embargo, es posible que podamos encontrar un medicamento sustituto a menor costo o llenar un formulario para que el seguro cubra el medicamento que se considera necesario.   Si se requiere una autorizacin previa para que su compaa de seguros cubra su medicamento, por favor permtanos de 1 a 2 das hbiles para completar este proceso.  Los precios de los medicamentos varan con frecuencia dependiendo del lugar de dnde se surte la receta y alguna farmacias pueden ofrecer precios ms baratos.  El sitio web www.goodrx.com tiene cupones para medicamentos de diferentes farmacias. Los precios aqu no tienen en cuenta lo que podra costar con la ayuda del seguro (puede ser ms barato con su seguro), pero el sitio web puede darle el  precio si no utiliz ningn seguro.  - Puede imprimir el cupn correspondiente y llevarlo con su receta a la farmacia.  - Tambin puede pasar por nuestra oficina durante el horario de atencin regular y recoger una tarjeta de cupones de GoodRx.  - Si necesita que su receta se enve electrnicamente a una farmacia diferente, informe a nuestra oficina a travs de MyChart de [place] o por telfono llamando al [phone] y presione la opcin 4.  

## 2023-01-12 ENCOUNTER — Encounter: Payer: Self-pay | Admitting: Dermatology

## 2023-01-16 ENCOUNTER — Other Ambulatory Visit: Payer: BC Managed Care – PPO

## 2023-01-22 ENCOUNTER — Ambulatory Visit
Admission: RE | Admit: 2023-01-22 | Discharge: 2023-01-22 | Disposition: A | Discharge status: Home / Self Care | Payer: BC Managed Care – PPO | Source: Ambulatory Visit | Attending: Oncology | Admitting: Oncology

## 2023-01-22 ENCOUNTER — Telehealth: Payer: Self-pay

## 2023-01-22 DIAGNOSIS — Z79811 Long term (current) use of aromatase inhibitors: Secondary | ICD-10-CM | POA: Diagnosis present

## 2023-01-22 DIAGNOSIS — Z170 Estrogen receptor positive status [ER+]: Secondary | ICD-10-CM | POA: Diagnosis present

## 2023-01-22 DIAGNOSIS — Z853 Personal history of malignant neoplasm of breast: Secondary | ICD-10-CM | POA: Diagnosis present

## 2023-01-22 DIAGNOSIS — Z08 Encounter for follow-up examination after completed treatment for malignant neoplasm: Secondary | ICD-10-CM | POA: Diagnosis not present

## 2023-01-22 DIAGNOSIS — C50412 Malignant neoplasm of upper-outer quadrant of left female breast: Secondary | ICD-10-CM | POA: Insufficient documentation

## 2023-01-22 DIAGNOSIS — Z13820 Encounter for screening for osteoporosis: Secondary | ICD-10-CM | POA: Diagnosis present

## 2023-01-22 NOTE — Telephone Encounter (Signed)
Patient advised of pathology results, and appointment scheduled.

## 2023-01-22 NOTE — Telephone Encounter (Signed)
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  Make pt appt in 6 mos.

## 2023-06-06 ENCOUNTER — Inpatient Hospital Stay: Payer: BC Managed Care – PPO | Attending: Oncology | Admitting: Oncology

## 2023-06-06 ENCOUNTER — Encounter: Payer: Self-pay | Admitting: Oncology

## 2023-06-06 VITALS — BP 137/108 | HR 92 | Temp 97.9°F | Resp 18 | Wt 130.1 lb

## 2023-06-06 DIAGNOSIS — Z86018 Personal history of other benign neoplasm: Secondary | ICD-10-CM | POA: Insufficient documentation

## 2023-06-06 DIAGNOSIS — Z79811 Long term (current) use of aromatase inhibitors: Secondary | ICD-10-CM | POA: Insufficient documentation

## 2023-06-06 DIAGNOSIS — Z08 Encounter for follow-up examination after completed treatment for malignant neoplasm: Secondary | ICD-10-CM

## 2023-06-06 DIAGNOSIS — Z79899 Other long term (current) drug therapy: Secondary | ICD-10-CM | POA: Diagnosis not present

## 2023-06-06 DIAGNOSIS — Z170 Estrogen receptor positive status [ER+]: Secondary | ICD-10-CM | POA: Diagnosis not present

## 2023-06-06 DIAGNOSIS — C50412 Malignant neoplasm of upper-outer quadrant of left female breast: Secondary | ICD-10-CM | POA: Insufficient documentation

## 2023-06-06 DIAGNOSIS — Z90710 Acquired absence of both cervix and uterus: Secondary | ICD-10-CM | POA: Insufficient documentation

## 2023-06-06 DIAGNOSIS — C773 Secondary and unspecified malignant neoplasm of axilla and upper limb lymph nodes: Secondary | ICD-10-CM | POA: Diagnosis not present

## 2023-06-06 DIAGNOSIS — Z5181 Encounter for therapeutic drug level monitoring: Secondary | ICD-10-CM

## 2023-06-07 NOTE — Progress Notes (Signed)
Hematology/Oncology Consult note [facility]  Telephone:([phone] [phone]  Patient Care Team: Christeen Douglas, MD as PCP - General (Obstetrics and Gynecology) Creig Hines, MD as Consulting Physician (Hematology and Oncology)   Name of the patient: Tabitha Tucker  [id number]  01-Aug-1960   Date of visit: 06/07/23  Diagnosis-  stage I ER positive breast cancer on Arimidex   Chief complaint/ Reason for visit-routine follow-up of breast cancer on Arimidex  Heme/Onc history: patient is a 62 year old Caucasian female who underwent a screening mammogram which showed 1.7 x 1.2 x 1.6 cm hypoechoic irregular area in her left breast at 1 o'clock position.  No evidence of left axillary adenopathy.  This was 5 biopsied and was invasive mammary carcinoma grade 2, ER greater than 90% positive PR 11 to 50% positive and HER-2/neu negative.  Patient will be seeing surgery tomorrow.  She has never had prior breast biopsies.  She is G2 P2 L2.  She has had a hysterectomy many years ago.  No significant family history of breast cancer, ovarian cancer, colon gastric or pancreatic cancer.    Patient underwent lumpectomy with sentinel lymph node biopsy on 07/25/2018.  Final pathology showed invasive mammary carcinoma 1.7 cm, grade 2 with negative margins.  2 out of 2 sentinel lymph nodes were positive for malignancy.  One lymph node was involved by micrometastatic carcinoma 1.5 mm and the other lymph node was involved with macro metastatic disease 5 mm.  There was focal microscopy extracapsular extension present which I verified with Dr. Oneita Kras from pathology.  pT1c pN1A.  Systemic CT scans denied by insurance.  She completed adjuvant radiation treatment   Patient underwent MammaPrint testing which came back as low risk with no significant benefit from adjuvant chemotherapy.  Patient started taking Arimidex in May 2020.    Interval history-she is doing well overall.   Occasional pain at the lumpectomy site.  Denies any new aches and pains anywhere.  She is tolerating Arimidex well without any significant side effects  ECOG PS- 0 Pain scale- 0   Review of systems- Review of Systems  Constitutional:  Negative for chills, fever, malaise/fatigue and weight loss.  HENT:  Negative for congestion, ear discharge and nosebleeds.   Eyes:  Negative for blurred vision.  Respiratory:  Negative for cough, hemoptysis, sputum production, shortness of breath and wheezing.   Cardiovascular:  Negative for chest pain, palpitations, orthopnea and claudication.  Gastrointestinal:  Negative for abdominal pain, blood in stool, constipation, diarrhea, heartburn, melena, nausea and vomiting.  Genitourinary:  Negative for dysuria, flank pain, frequency, hematuria and urgency.  Musculoskeletal:  Negative for back pain, joint pain and myalgias.  Skin:  Negative for rash.  Neurological:  Negative for dizziness, tingling, focal weakness, seizures, weakness and headaches.  Endo/Heme/Allergies:  Does not bruise/bleed easily.  Psychiatric/Behavioral:  Negative for depression and suicidal ideas. The patient does not have insomnia.       No Known Allergies   Past Medical History:  Diagnosis Date   Breast cancer (HCC) 07/2018   Dysplastic nevus 01/11/2023   RUQA periumbilical - severe, but margins free, recheck in 6 mths   High cholesterol    Panic attacks    Personal history of radiation therapy    PONV (postoperative nausea and vomiting)      Past Surgical History:  Procedure Laterality Date   ABDOMINAL HYSTERECTOMY     pt did keep ovaries   APPENDECTOMY     ate age 32  BREAST BIOPSY Left 07/10/2018   INVASIVE MAMMARY CARCINOMA   BREAST LUMPECTOMY Left 07/25/2018   [facility],    BREAST LUMPECTOMY WITH NEEDLE LOCALIZATION AND AXILLARY SENTINEL LYMPH NODE BX Left 07/25/2018   Procedure: BREAST LUMPECTOMY WITH NEEDLE LOCALIZATION AND SENTINEL NODE BX;  Surgeon: Sung Amabile,  DO;  Location: [facility] ORS;  Service: General;  Laterality: Left;    Social History   Socioeconomic History   Marital status: Married    Spouse name: Not on file   Number of children: Not on file   Years of education: Not on file   Highest education level: Not on file  Occupational History   Not on file  Tobacco Use   Smoking status: Never   Smokeless tobacco: Never  Vaping Use   Vaping status: Never Used  Substance and Sexual Activity   Alcohol use: Yes    Alcohol/week: 14.0 standard drinks of alcohol    Types: 14 Glasses of wine per week    Comment: 2 glasses of wine every day   Drug use: Not Currently    Types: Marijuana    Comment: marijuana when she was a teenager a few time   Sexual activity: Yes  Other Topics Concern   Not on file  Social History Narrative   Not on file   Social Determinants of Health   Financial Resource Strain: Not on file  Food Insecurity: Not on file  Transportation Needs: Not on file  Physical Activity: Not on file  Stress: Not on file  Social Connections: Not on file  Intimate Partner Violence: Not on file    Family History  Problem Relation Age of Onset   Hypertension Father    Breast cancer Neg Hx      Current Outpatient Medications:    ALPRAZolam (XANAX) 0.25 MG tablet, Take 0.25 mg by mouth 2 (two) times daily as needed for anxiety. (Patient not taking: Reported on 11/25/2021), Disp: , Rfl:    amitriptyline (ELAVIL) 10 MG tablet, Take 10 mg by mouth at bedtime. (Patient not taking: Reported on 06/06/2023), Disp: , Rfl:    anastrozole (ARIMIDEX) 1 MG tablet, TAKE 1 TABLET BY MOUTH DAILY, Disp: 90 tablet, Rfl: 2   B Complex-C (SUPER B COMPLEX PO), Take 1 tablet by mouth daily., Disp: , Rfl:    Calcium Carbonate-Vit D-Min (CALCIUM 600+D PLUS MINERALS) 600-400 MG-UNIT TABS, Take 2 tablets by mouth daily., Disp: , Rfl:    Fluocinolone Acetonide (DERMOTIC) 0.01 % OIL, Place 1 Application in ear(s) as directed. qd 5 days a week for 2  weeks, then decrease to 2-3 days a week as needed for flares to ears for seborrheic dermatitis, Disp: 20 mL, Rfl: 4   Garlic 1000 MG CAPS, Take 1,000 mg by mouth daily., Disp: , Rfl:    pantoprazole (PROTONIX) 40 MG tablet, Take by mouth., Disp: , Rfl:    simvastatin (ZOCOR) 10 MG tablet, Take 10 mg by mouth daily., Disp: , Rfl:   Physical exam:  Vitals:   06/06/23 1306 06/06/23 1309  BP: (!) 146/108 (!) 137/108  Pulse: 92   Resp: 18   Temp: 97.9 F (36.6 C)   TempSrc: Tympanic   SpO2: 100%   Weight: 130 lb 1.6 oz (59 kg)    Physical Exam Cardiovascular:     Rate and Rhythm: Normal rate and regular rhythm.     Heart sounds: Normal heart sounds.  Pulmonary:     Effort: Pulmonary effort is normal.     Breath  sounds: Normal breath sounds.  Skin:    General: Skin is warm and dry.  Neurological:     Mental Status: She is alert and oriented to person, place, and time.   Breast exam was performed in seated and lying down position. Patient is status post left lumpectomy with a well-healed surgical scar. No evidence of any palpable masses. No evidence of axillary adenopathy. No evidence of any palpable masses or lumps in the right breast. No evidence of right axillary adenopathy       Latest Ref Rng & Units 05/29/2022   12:00 PM  CMP  Glucose 70 - 99 mg/dL 97   BUN 8 - 23 mg/dL 13   Creatinine 6.96 - 1.00 mg/dL 2.95   Sodium 284 - 132 mmol/L 136   Potassium 3.5 - 5.1 mmol/L 3.6   Chloride 98 - 111 mmol/L 104   CO2 22 - 32 mmol/L 25   Calcium 8.9 - 10.3 mg/dL 9.1   Total Protein 6.5 - 8.1 g/dL 7.7   Total Bilirubin 0.3 - 1.2 mg/dL 0.7   Alkaline Phos 38 - 126 U/L 63   AST 15 - 41 U/L 22   ALT 0 - 44 U/L 15       Latest Ref Rng & Units 05/29/2022   12:34 PM  CBC  WBC 4.0 - 10.5 K/uL 5.5   Hemoglobin 12.0 - 15.0 g/dL 44.0   Hematocrit 10.2 - 46.0 % 41.5   Platelets 150 - 400 K/uL 281     No images are attached to the encounter.  No results found.   Assessment and  plan- Patient is a 62 y.o. female with history of stage I ER positive breast cancer on Arimidex here for routine follow-up  Patient will be completing 5 years of endocrine therapy in May 2025.  Her lumpectomy pathology showed a 1.7 cm grade 2 tumor.  2 out of 2 sentinel lymph nodes were positive for malignancy 1 with micrometastatic disease and the other with macro metastatic disease.  However because her MammaPrint score was low risk she did not require any adjuvant chemotherapy.  Based on this 1 could argue that she could continue with endocrine therapy for 5 more years completing 10 years of treatment especially given that her bone density scan also continues to remain normal on aromatase inhibitor.  Her insurance will not be covering BCI testing to determine if she would benefit from extended endocrine therapy.  I would be okay with patient continuing Arimidex for 5 additional years.  She will let us know which way she would like to proceed.  I will see her back in 6 months no labs   Visit Diagnosis 1. Encounter for follow-up surveillance of breast cancer   2. High risk medication use   3. Visit for monitoring Arimidex therapy      Dr. Owens Shark, MD, MPH [facility] [phone] 06/07/2023 7:09 PM

## 2023-07-20 ENCOUNTER — Other Ambulatory Visit: Payer: Self-pay

## 2023-07-20 ENCOUNTER — Emergency Department
Admission: EM | Admit: 2023-07-20 | Discharge: 2023-07-20 | Disposition: A | Discharge status: Other Acute Inpatient Hospital | Payer: 59 | Attending: Emergency Medicine | Admitting: Emergency Medicine

## 2023-07-20 ENCOUNTER — Inpatient Hospital Stay
Admission: RE | Admit: 2023-07-20 | Discharge: 2023-07-24 | DRG: 881 | Disposition: A | Discharge status: Home / Self Care | Payer: 59 | Source: Intra-hospital | Attending: Psychiatry | Admitting: Psychiatry

## 2023-07-20 ENCOUNTER — Encounter: Payer: Self-pay | Admitting: Emergency Medicine

## 2023-07-20 DIAGNOSIS — Z79899 Other long term (current) drug therapy: Secondary | ICD-10-CM

## 2023-07-20 DIAGNOSIS — F332 Major depressive disorder, recurrent severe without psychotic features: Secondary | ICD-10-CM | POA: Diagnosis not present

## 2023-07-20 DIAGNOSIS — Z853 Personal history of malignant neoplasm of breast: Secondary | ICD-10-CM | POA: Diagnosis not present

## 2023-07-20 DIAGNOSIS — F411 Generalized anxiety disorder: Secondary | ICD-10-CM | POA: Insufficient documentation

## 2023-07-20 DIAGNOSIS — F5104 Psychophysiologic insomnia: Secondary | ICD-10-CM | POA: Diagnosis present

## 2023-07-20 DIAGNOSIS — Z639 Problem related to primary support group, unspecified: Secondary | ICD-10-CM | POA: Diagnosis not present

## 2023-07-20 DIAGNOSIS — F329 Major depressive disorder, single episode, unspecified: Principal | ICD-10-CM | POA: Diagnosis present

## 2023-07-20 DIAGNOSIS — Z923 Personal history of irradiation: Secondary | ICD-10-CM | POA: Diagnosis not present

## 2023-07-20 DIAGNOSIS — Z8249 Family history of ischemic heart disease and other diseases of the circulatory system: Secondary | ICD-10-CM

## 2023-07-20 DIAGNOSIS — F1020 Alcohol dependence, uncomplicated: Secondary | ICD-10-CM | POA: Diagnosis present

## 2023-07-20 DIAGNOSIS — F32A Depression, unspecified: Secondary | ICD-10-CM

## 2023-07-20 DIAGNOSIS — F410 Panic disorder [episodic paroxysmal anxiety] without agoraphobia: Secondary | ICD-10-CM | POA: Diagnosis present

## 2023-07-20 DIAGNOSIS — Z79811 Long term (current) use of aromatase inhibitors: Secondary | ICD-10-CM

## 2023-07-20 DIAGNOSIS — E7800 Pure hypercholesterolemia, unspecified: Secondary | ICD-10-CM | POA: Diagnosis present

## 2023-07-20 DIAGNOSIS — R45851 Suicidal ideations: Secondary | ICD-10-CM | POA: Diagnosis present

## 2023-07-20 LAB — CBC
HCT: 45.5 % (ref 36.0–46.0)
Hemoglobin: 15.2 g/dL — ABNORMAL HIGH (ref 12.0–15.0)
MCH: 32.2 pg (ref 26.0–34.0)
MCHC: 33.4 g/dL (ref 30.0–36.0)
MCV: 96.4 fL (ref 80.0–100.0)
Platelets: 347 10*3/uL (ref 150–400)
RBC: 4.72 MIL/uL (ref 3.87–5.11)
RDW: 12.2 % (ref 11.5–15.5)
WBC: 16.6 10*3/uL — ABNORMAL HIGH (ref 4.0–10.5)
nRBC: 0 % (ref 0.0–0.2)

## 2023-07-20 LAB — COMPREHENSIVE METABOLIC PANEL
ALT: 20 U/L (ref 0–44)
AST: 34 U/L (ref 15–41)
Albumin: 5 g/dL (ref 3.5–5.0)
Alkaline Phosphatase: 70 U/L (ref 38–126)
Anion gap: 18 — ABNORMAL HIGH (ref 5–15)
BUN: 15 mg/dL (ref 8–23)
CO2: 22 mmol/L (ref 22–32)
Calcium: 9.3 mg/dL (ref 8.9–10.3)
Chloride: 104 mmol/L (ref 98–111)
Creatinine, Ser: 1.1 mg/dL — ABNORMAL HIGH (ref 0.44–1.00)
GFR, Estimated: 56 mL/min — ABNORMAL LOW (ref >60)
Glucose, Bld: 118 mg/dL — ABNORMAL HIGH (ref 70–99)
Potassium: 4.3 mmol/L (ref 3.5–5.1)
Sodium: 144 mmol/L (ref 135–145)
Total Bilirubin: 0.8 mg/dL (ref 0.0–1.2)
Total Protein: 8.3 g/dL — ABNORMAL HIGH (ref 6.5–8.1)

## 2023-07-20 LAB — ETHANOL: Alcohol, Ethyl (B): 111 mg/dL — ABNORMAL HIGH (ref <10)

## 2023-07-20 LAB — ACETAMINOPHEN LEVEL: Acetaminophen (Tylenol), Serum: <10 ug/mL — ABNORMAL LOW (ref 10–30)

## 2023-07-20 LAB — URINE DRUG SCREEN, QUALITATIVE (ARMC ONLY)
Amphetamines, Ur Screen: NOT DETECTED
Barbiturates, Ur Screen: NOT DETECTED
Benzodiazepine, Ur Scrn: NOT DETECTED
Cannabinoid 50 Ng, Ur ~~LOC~~: POSITIVE — AB
Cocaine Metabolite,Ur ~~LOC~~: NOT DETECTED
MDMA (Ecstasy)Ur Screen: NOT DETECTED
Methadone Scn, Ur: NOT DETECTED
Opiate, Ur Screen: NOT DETECTED
Phencyclidine (PCP) Ur S: NOT DETECTED
Tricyclic, Ur Screen: NOT DETECTED

## 2023-07-20 LAB — URINALYSIS, ROUTINE W REFLEX MICROSCOPIC
Bilirubin Urine: NEGATIVE
Glucose, UA: NEGATIVE mg/dL
Hgb urine dipstick: NEGATIVE
Ketones, ur: 20 mg/dL — AB
Leukocytes,Ua: NEGATIVE
Nitrite: NEGATIVE
Protein, ur: NEGATIVE mg/dL
Specific Gravity, Urine: 1.008 (ref 1.005–1.030)
pH: 5 (ref 5.0–8.0)

## 2023-07-20 LAB — SALICYLATE LEVEL: Salicylate Lvl: <7 mg/dL — ABNORMAL LOW (ref 7.0–30.0)

## 2023-07-20 MED ORDER — DIPHENHYDRAMINE HCL 25 MG PO CAPS: 50.0000 mg | ORAL_CAPSULE | Freq: Three times a day (TID) | ORAL | Status: DC | PRN

## 2023-07-20 MED ORDER — ALUM & MAG HYDROXIDE-SIMETH 200-200-20 MG/5ML PO SUSP: 30.0000 mL | ORAL | Status: DC | PRN

## 2023-07-20 MED ORDER — PANTOPRAZOLE SODIUM 40 MG PO TBEC
40.0000 mg | DELAYED_RELEASE_TABLET | Freq: Every day | ORAL | Status: DC
  Administered 2023-07-21 – 2023-07-24 (×4): 40 mg via ORAL
  Filled 2023-07-20 (×4): qty 1

## 2023-07-20 MED ORDER — DIPHENHYDRAMINE HCL 50 MG/ML IJ SOLN
50.0000 mg | Freq: Three times a day (TID) | INTRAMUSCULAR | Status: DC | PRN
Start: 2023-07-20 — End: 2023-07-24

## 2023-07-20 MED ORDER — OYSTER SHELL CALCIUM/D3 500-5 MG-MCG PO TABS
1.0000 | ORAL_TABLET | Freq: Every day | ORAL | Status: DC
  Administered 2023-07-21 – 2023-07-24 (×4): 1 via ORAL
  Filled 2023-07-20 (×4): qty 1

## 2023-07-20 MED ORDER — ONDANSETRON 4 MG PO TBDP
4.0000 mg | ORAL_TABLET | Freq: Once | ORAL | Status: AC
  Administered 2023-07-20: 4 mg via ORAL
  Filled 2023-07-20: qty 1

## 2023-07-20 MED ORDER — HALOPERIDOL LACTATE 5 MG/ML IJ SOLN
5.0000 mg | Freq: Three times a day (TID) | INTRAMUSCULAR | Status: DC | PRN
Start: 2023-07-20 — End: 2023-07-24

## 2023-07-20 MED ORDER — HALOPERIDOL 5 MG PO TABS: 5.0000 mg | ORAL_TABLET | Freq: Three times a day (TID) | ORAL | Status: DC | PRN

## 2023-07-20 MED ORDER — MELATONIN 5 MG PO TABS
5.0000 mg | ORAL_TABLET | Freq: Every day | ORAL | Status: DC
  Administered 2023-07-20: 5 mg via ORAL
  Filled 2023-07-20: qty 1

## 2023-07-20 MED ORDER — DIPHENHYDRAMINE HCL 50 MG/ML IJ SOLN: 50.0000 mg | Freq: Three times a day (TID) | INTRAMUSCULAR | Status: DC | PRN

## 2023-07-20 MED ORDER — PANTOPRAZOLE SODIUM 40 MG PO TBEC
40.0000 mg | DELAYED_RELEASE_TABLET | Freq: Every day | ORAL | Status: DC
  Administered 2023-07-20: 40 mg via ORAL
  Filled 2023-07-20: qty 1

## 2023-07-20 MED ORDER — LORAZEPAM 2 MG/ML IJ SOLN: 2.0000 mg | Freq: Three times a day (TID) | INTRAMUSCULAR | Status: DC | PRN

## 2023-07-20 MED ORDER — LORAZEPAM 2 MG/ML IJ SOLN
2.0000 mg | Freq: Three times a day (TID) | INTRAMUSCULAR | Status: DC | PRN
Start: 2023-07-20 — End: 2023-07-24

## 2023-07-20 MED ORDER — CALCIUM 600+D PLUS MINERALS 600-400 MG-UNIT PO TABS: 2.0000 | ORAL_TABLET | Freq: Every day | ORAL | Status: DC

## 2023-07-20 MED ORDER — HYDROXYZINE HCL 25 MG PO TABS: 25.0000 mg | ORAL_TABLET | Freq: Three times a day (TID) | ORAL | Status: DC | PRN

## 2023-07-20 MED ORDER — ANASTROZOLE 1 MG PO TABS
1.0000 mg | ORAL_TABLET | Freq: Every day | ORAL | Status: DC
  Administered 2023-07-20: 1 mg via ORAL
  Filled 2023-07-20: qty 1

## 2023-07-20 MED ORDER — ANASTROZOLE 1 MG PO TABS
1.0000 mg | ORAL_TABLET | Freq: Every day | ORAL | Status: DC
  Administered 2023-07-21 – 2023-07-24 (×4): 1 mg via ORAL
  Filled 2023-07-20 (×4): qty 1

## 2023-07-20 MED ORDER — OYSTER SHELL CALCIUM/D3 500-5 MG-MCG PO TABS
1.0000 | ORAL_TABLET | Freq: Every day | ORAL | Status: DC
  Administered 2023-07-20: 1 via ORAL
  Filled 2023-07-20: qty 1

## 2023-07-20 MED ORDER — SIMVASTATIN 10 MG PO TABS
10.0000 mg | ORAL_TABLET | Freq: Every day | ORAL | Status: DC
  Administered 2023-07-20: 10 mg via ORAL
  Filled 2023-07-20: qty 1

## 2023-07-20 MED ORDER — TRAZODONE HCL 50 MG PO TABS
50.0000 mg | ORAL_TABLET | Freq: Every evening | ORAL | Status: DC | PRN
  Administered 2023-07-20 – 2023-07-23 (×4): 50 mg via ORAL
  Filled 2023-07-20 (×4): qty 1

## 2023-07-20 MED ORDER — HALOPERIDOL LACTATE 5 MG/ML IJ SOLN
10.0000 mg | Freq: Three times a day (TID) | INTRAMUSCULAR | Status: DC | PRN
Start: 2023-07-20 — End: 2023-07-24

## 2023-07-20 MED ORDER — SIMVASTATIN 10 MG PO TABS
10.0000 mg | ORAL_TABLET | Freq: Every day | ORAL | Status: DC
  Administered 2023-07-21 – 2023-07-23 (×3): 10 mg via ORAL
  Filled 2023-07-20 (×3): qty 1

## 2023-07-20 MED ORDER — MAGNESIUM HYDROXIDE 400 MG/5ML PO SUSP
30.0000 mL | Freq: Every day | ORAL | Status: DC | PRN
  Administered 2023-07-22: 30 mL via ORAL
  Filled 2023-07-20: qty 30

## 2023-07-20 MED ORDER — ACETAMINOPHEN 325 MG PO TABS: 650.0000 mg | ORAL_TABLET | Freq: Four times a day (QID) | ORAL | Status: DC | PRN

## 2023-07-20 NOTE — ED Notes (Signed)
IVC/ pending assessment & consult

## 2023-07-20 NOTE — ED Notes (Signed)
 RN provided breakfast tray.
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
## 2023-07-20 NOTE — ED Provider Notes (Signed)
Emergency Medicine Observation Re-evaluation Note  Physical Exam   BP 120/75   Pulse (!) 105   Temp 97.6 F (36.4 C) (Oral)   Resp 18   Ht 5\' 4"  (1.626 m)   Wt 60 kg   SpO2 100%   BMI 22.71 kg/m   Patient appears in no acute distress.  ED Course / MDM   No reported events during my shift at the time of this note.   Pt is awaiting dispo from consultants   Pilar Jarvis MD    Pilar Jarvis, MD 07/20/23 [phone]

## 2023-07-20 NOTE — ED Notes (Signed)
Pt accepted to [facility]. Room 323. Attending Dr. Marlou Porch.

## 2023-07-20 NOTE — ED Notes (Signed)
Pt did not eat breakfast tray, states she isn't hungry.

## 2023-07-20 NOTE — ED Notes (Signed)
Patient belongings: 1 pajama top 1 pajama bottom 1 yellow camisole No underwear/bra 1 sock

## 2023-07-20 NOTE — ED Notes (Signed)
Hospital meal provided.  50% consumed, pt tolerated w/o complaints.  Waste discarded appropriately.

## 2023-07-20 NOTE — ED Notes (Signed)
Pt given meal tray.

## 2023-07-20 NOTE — ED Notes (Signed)
All legal paperwork scanned and e-filed

## 2023-07-20 NOTE — ED Notes (Signed)
Attempted to call report, told to wait until after med pass and call back

## 2023-07-20 NOTE — ED Notes (Signed)
IVC PENDING  CONSULT ?
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
## 2023-07-21 ENCOUNTER — Encounter: Payer: Self-pay | Admitting: Psychiatric/Mental Health

## 2023-07-21 DIAGNOSIS — F332 Major depressive disorder, recurrent severe without psychotic features: Secondary | ICD-10-CM | POA: Diagnosis not present

## 2023-07-21 MED ORDER — FLUOXETINE HCL 10 MG PO CAPS
10.0000 mg | ORAL_CAPSULE | Freq: Every day | ORAL | Status: DC
  Administered 2023-07-21 – 2023-07-24 (×4): 10 mg via ORAL
  Filled 2023-07-21 (×4): qty 1

## 2023-07-21 NOTE — Group Note (Signed)
LCSW Group Therapy Note   Group Date: 07/21/2023 Start Time: 1330 End Time: 1405   Type of Therapy and Topic:  Group Therapy: Goals after Discharge  Participation Level:  Active    Summary of Patient Progress:  The patient attended group. Patient proved open to input from peers and feedback from [facility]. The patient was respectful of peers and participated throughout the entire session. The patient participated during today's icebreaker question. The patient stated that she loves to cook and bake.     Marshell Levan, LCSWA 07/21/2023  2:22 PM
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
## 2023-07-21 NOTE — Plan of Care (Signed)
  Problem: Education: Goal: Knowledge of  General Education information/materials will improve Outcome: Progressing Goal: Emotional status will improve Outcome: Progressing Goal: Mental status will improve Outcome: Progressing Goal: Verbalization of understanding the information provided will improve Outcome: Progressing   Problem: Activity: Goal: Interest or engagement in activities will improve Outcome: Progressing Goal: Sleeping patterns will improve Outcome: Progressing   Problem: Coping: Goal: Ability to verbalize frustrations and anger appropriately will improve Outcome: Progressing Goal: Ability to demonstrate self-control will improve Outcome: Progressing   Problem: Health Behavior/Discharge Planning: Goal: Identification of resources available to assist in meeting health care needs will improve Outcome: Progressing Goal: Compliance with treatment plan for underlying cause of condition will improve Outcome: Progressing   Problem: Physical Regulation: Goal: Ability to maintain clinical measurements within normal limits will improve Outcome: Progressing   Problem: Safety: Goal: Periods of time without injury will increase Outcome: Progressing   Problem: Education: Goal: Ability to state activities that reduce stress will improve Outcome: Progressing   Problem: Coping: Goal: Ability to identify and develop effective coping behavior will improve Outcome: Progressing   Problem: Self-Concept: Goal: Ability to identify factors that promote anxiety will improve Outcome: Progressing Goal: Level of anxiety will decrease Outcome: Progressing Goal: Ability to modify response to factors that promote anxiety will improve Outcome: Progressing   Problem: Education: Goal: Ability to make informed decisions regarding treatment will improve Outcome: Progressing   Problem: Coping: Goal: Coping ability will improve Outcome: Progressing   Problem: Health  Behavior/Discharge Planning: Goal: Identification of resources available to assist in meeting health care needs will improve Outcome: Progressing   Problem: Medication: Goal: Compliance with prescribed medication regimen will improve Outcome: Progressing   Problem: Self-Concept: Goal: Ability to disclose and discuss suicidal ideas will improve Outcome: Progressing

## 2023-07-21 NOTE — Progress Notes (Signed)
   07/21/23 1500  Psych Admission Type (Psych Patients Only)  Admission Status Involuntary  Psychosocial Assessment  Patient Complaints Anxiety (patient reports "I always have panic attacks".)  Eye Contact Fair  Facial Expression Anxious  Affect Anxious  Speech Logical/coherent  Interaction Assertive  Motor Activity Fidgety (from anxiety)  Appearance/Hygiene In scrubs  Behavior Characteristics Cooperative;Appropriate to situation;Fidgety;Anxious  Mood Anxious;Pleasant  Aggressive Behavior  Effect No apparent injury  Thought Process  Coherency WDL  Content Blaming others (Patient states "I don't think I'm depressed, but everybody else does".)  Delusions None reported or observed  Perception WDL  Hallucination None reported or observed  Judgment WDL  Confusion None  Danger to Self  Current suicidal ideation? Denies  Agreement Not to Harm Self Yes  Description of Agreement Verbal  Danger to Others  Danger to Others None reported or observed

## 2023-07-21 NOTE — Plan of Care (Signed)
Pt denies SI/HI/AV. Pt is pleasant and cooperative. Pt goal for today is to work on her communication with her family. Pt was offered support and encouragement. Pt was given PRN medication for sleep. Pt educated and encourage to attend groups, awareness of Q 15 minute checks done for safety.  Pt verbalized plan to attend group and interact with peers and staff. Pt is taking medication. Pt has no complaints. Pt receptive to treatment and safety maintained on unit.   Problem: Education: Goal: Knowledge of [place] [facility] Education information/materials will improve Outcome: Not Progressing Goal: Emotional status will improve Outcome: Not Progressing Goal: Mental status will improve Outcome: Not Progressing Goal: Verbalization of understanding the information provided will improve Outcome: Not Progressing   Problem: Activity: Goal: Interest or engagement in activities will improve Outcome: Not Progressing Goal: Sleeping patterns will improve Outcome: Not Progressing   Problem: Coping: Goal: Ability to verbalize frustrations and anger appropriately will improve Outcome: Not Progressing Goal: Ability to demonstrate self-control will improve Outcome: Not Progressing   Problem: Health Behavior/Discharge Planning: Goal: Identification of resources available to assist in meeting health care needs will improve Outcome: Not Progressing Goal: Compliance with treatment plan for underlying cause of condition will improve Outcome: Not Progressing   Problem: Physical Regulation: Goal: Ability to maintain clinical measurements within normal limits will improve Outcome: Not Progressing   Problem: Safety: Goal: Periods of time without injury will increase Outcome: Not Progressing   Problem: Education: Goal: Ability to state activities that reduce stress will improve Outcome: Not Progressing   Problem: Coping: Goal: Ability to identify and develop effective coping behavior will  improve Outcome: Not Progressing   Problem: Self-Concept: Goal: Ability to identify factors that promote anxiety will improve Outcome: Not Progressing Goal: Level of anxiety will decrease Outcome: Not Progressing Goal: Ability to modify response to factors that promote anxiety will improve Outcome: Not Progressing   Problem: Education: Goal: Ability to make informed decisions regarding treatment will improve Outcome: Not Progressing   Problem: Coping: Goal: Coping ability will improve Outcome: Not Progressing   Problem: Health Behavior/Discharge Planning: Goal: Identification of resources available to assist in meeting health care needs will improve Outcome: Not Progressing   Problem: Medication: Goal: Compliance with prescribed medication regimen will improve Outcome: Not Progressing   Problem: Self-Concept: Goal: Ability to disclose and discuss suicidal ideas will improve Outcome: Not Progressing Goal: Will verbalize positive feelings about self Outcome: Not Progressing Note: Patient is initiating therapy. Patient will adhere to provider and/or lab appointments, avoid flare triggers, be monitored by provider to determine if a change in treatment plan is warranted, and be evaluated at upcoming provider appointment to assess progress.

## 2023-07-22 DIAGNOSIS — F332 Major depressive disorder, recurrent severe without psychotic features: Secondary | ICD-10-CM | POA: Diagnosis not present

## 2023-07-22 NOTE — Plan of Care (Signed)
  Problem: Education: Goal: Mental status will improve Outcome: Progressing   Problem: Education: Goal: Verbalization of understanding the information provided will improve Outcome: Progressing

## 2023-07-22 NOTE — Progress Notes (Signed)
Pt was calm and cooperative, compliant with medications.  Pt denies SI HI AVH.  Continued monitoring for safety.  07/22/23 1000  Psych Admission Type (Psych Patients Only)  Admission Status Involuntary  Psychosocial Assessment  Patient Complaints Anxiety  Eye Contact Fair  Facial Expression Anxious  Affect Anxious  Speech Logical/coherent  Interaction Assertive  Motor Activity Slow  Appearance/Hygiene In scrubs  Behavior Characteristics Cooperative  Mood Pleasant  Thought Process  Coherency WDL  Content WDL  Delusions None reported or observed  Perception WDL  Hallucination None reported or observed  Judgment WDL  Confusion None  Danger to Self  Current suicidal ideation? Denies  Danger to Others  Danger to Others None reported or observed

## 2023-07-22 NOTE — Progress Notes (Signed)
   07/21/23 1500  Psych Admission Type (Psych Patients Only)  Admission Status Involuntary  Psychosocial Assessment  Patient Complaints Anxiety (patient reports "I always have panic attacks".)  Eye Contact Fair  Facial Expression Anxious  Affect Anxious  Speech Logical/coherent  Interaction Assertive  Motor Activity Fidgety (from anxiety)  Appearance/Hygiene In scrubs  Behavior Characteristics Cooperative;Appropriate to situation;Fidgety;Anxious  Mood Anxious;Pleasant  Aggressive Behavior  Effect No apparent injury  Thought Process  Coherency WDL  Content Blaming others (Patient states "I don't think I'm depressed, but everybody else does".)  Delusions None reported or observed  Perception WDL  Hallucination None reported or observed  Judgment WDL  Confusion None  Danger to Self  Current suicidal ideation? Denies  Agreement Not to Harm Self Yes  Description of Agreement Verbal  Danger to Others  Danger to Others None reported or observed   Patient is alert and oriented x 4, thoughts are organized and coherent, he denies SI/HI/AVH. 15 minutes safety checks maintained

## 2023-07-22 NOTE — Group Note (Signed)
Date:  07/22/2023 Time:  9:38 PM  Group Topic/Focus:  Healthy Communication:   The focus of this group is to discuss communication, barriers to communication, as well as healthy ways to communicate with others.    Participation Level:  Active  Participation Quality:  Appropriate, Attentive, and Sharing  Affect:  Appropriate  Cognitive:  Alert and Appropriate  Insight: Appropriate, Good, and Improving  Engagement in Group:  Developing/Improving and Engaged  Modes of Intervention:  Discussion, Problem-solving, and Support  Additional Comments:     Antavius Sperbeck 07/22/2023, 9:38 PM
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
## 2023-07-22 NOTE — Plan of Care (Signed)
  Problem: Activity: Goal: Interest or engagement in activities will improve Outcome: Progressing   Problem: Education: Goal: Mental status will improve Outcome: Progressing   Problem: Education: Goal: Emotional status will improve Outcome: Progressing

## 2023-07-22 NOTE — Plan of Care (Signed)
  Problem: Education: Goal: Mental status will improve Outcome: Progressing   Problem: Activity: Goal: Interest or engagement in activities will improve Outcome: Progressing   Problem: Coping: Goal: Ability to verbalize frustrations and anger appropriately will improve Outcome: Progressing
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
## 2023-07-22 NOTE — Progress Notes (Signed)
Patient is pleasant and cooperative. Noted in dayroom talking with peers. Denies SI, HI, AVH. Medication compliant. PRn given for sleep with good results.  Encouragement and support provided. Safety checks maintained. Medications given as prescribed. Pt receptive and remains safe on unit with q 15 min checks.

## 2023-07-23 DIAGNOSIS — F332 Major depressive disorder, recurrent severe without psychotic features: Secondary | ICD-10-CM

## 2023-07-23 NOTE — Progress Notes (Signed)
[facility] MD Progress Note  07/23/2023 10:03 PM NYEL HURSH  MRN:  [id number] Subjective:  *** Principal Problem: MDD (major depressive disorder) Diagnosis: Principal Problem:   MDD (major depressive disorder)  Total Time spent with patient: {Time; 15 min - 8 hours:17441}  Past Psychiatric History: ***  Past Medical History:  Past Medical History:  Diagnosis Date   Breast cancer (HCC) 07/2018   Dysplastic nevus 01/11/2023   RUQA periumbilical - severe, but margins free, recheck in 6 mths   High cholesterol    Panic attacks    Personal history of radiation therapy    PONV (postoperative nausea and vomiting)     Past Surgical History:  Procedure Laterality Date   ABDOMINAL HYSTERECTOMY     pt did keep ovaries   APPENDECTOMY     ate age 43   BREAST BIOPSY Left 07/10/2018   INVASIVE MAMMARY CARCINOMA   BREAST LUMPECTOMY Left 07/25/2018   [facility],    BREAST LUMPECTOMY WITH NEEDLE LOCALIZATION AND AXILLARY SENTINEL LYMPH NODE BX Left 07/25/2018   Procedure: BREAST LUMPECTOMY WITH NEEDLE LOCALIZATION AND SENTINEL NODE BX;  Surgeon: Sung Amabile, DO;  Location: [facility] ORS;  Service: General;  Laterality: Left;   Family History:  Family History  Problem Relation Age of Onset   Hypertension Father    Breast cancer Neg Hx    Family Psychiatric  History: *** Social History:  Social History   Substance and Sexual Activity  Alcohol Use Yes   Alcohol/week: 14.0 standard drinks of alcohol   Types: 14 Glasses of wine per week   Comment: 2 glasses of wine every day     Social History   Substance and Sexual Activity  Drug Use Not Currently   Types: Marijuana   Comment: marijuana when she was a teenager a few time    Social History   Socioeconomic History   Marital status: Married    Spouse name: Not on file   Number of children: Not on file   Years of education: Not on file   Highest education level: Not on file  Occupational History   Not on file  Tobacco Use   Smoking status:  Never   Smokeless tobacco: Never  Vaping Use   Vaping status: Never Used  Substance and Sexual Activity   Alcohol use: Yes    Alcohol/week: 14.0 standard drinks of alcohol    Types: 14 Glasses of wine per week    Comment: 2 glasses of wine every day   Drug use: Not Currently    Types: Marijuana    Comment: marijuana when she was a teenager a few time   Sexual activity: Yes  Other Topics Concern   Not on file  Social History Narrative   Not on file   Social Drivers of Health   Financial Resource Strain: Not on file  Food Insecurity: No Food Insecurity (07/20/2023)   Hunger Vital Sign    Worried About Running Out of Food in the Last Year: Never true    Ran Out of Food in the Last Year: Never true  Transportation Needs: No Transportation Needs (07/20/2023)   PRAPARE - Administrator, Civil Service (Medical): No    Lack of Transportation (Non-Medical): No  Physical Activity: Not on file  Stress: Not on file  Social Connections: Not on file   Additional Social History:  Sleep: {BHH GOOD/FAIR/POOR:22877}  Appetite:  {BHH GOOD/FAIR/POOR:22877}  Current Medications: Current Facility-Administered Medications  Medication Dose Route Frequency Provider Last Rate Last Admin   acetaminophen (TYLENOL) tablet 650 mg  650 mg Oral Q6H PRN Jearld Lesch, NP       alum & mag hydroxide-simeth (MAALOX/MYLANTA) 200-200-20 MG/5ML suspension 30 mL  30 mL Oral Q4H PRN Durwin Nora, Rashaun M, NP       anastrozole (ARIMIDEX) tablet 1 mg  1 mg Oral Daily Jearld Lesch, NP   1 mg at 07/23/23 8119   calcium-vitamin D (OSCAL WITH D) 500-5 MG-MCG per tablet 1 tablet  1 tablet Oral Q lunch Jearld Lesch, NP   1 tablet at 07/23/23 1247   haloperidol (HALDOL) tablet 5 mg  5 mg Oral TID PRN Jearld Lesch, NP       And   diphenhydrAMINE (BENADRYL) capsule 50 mg  50 mg Oral TID PRN Jearld Lesch, NP       haloperidol lactate (HALDOL) injection 5 mg  5 mg  Intramuscular TID PRN Jearld Lesch, NP       And   diphenhydrAMINE (BENADRYL) injection 50 mg  50 mg Intramuscular TID PRN Jearld Lesch, NP       And   LORazepam (ATIVAN) injection 2 mg  2 mg Intramuscular TID PRN Jearld Lesch, NP       haloperidol lactate (HALDOL) injection 10 mg  10 mg Intramuscular TID PRN Jearld Lesch, NP       And   diphenhydrAMINE (BENADRYL) injection 50 mg  50 mg Intramuscular TID PRN Jearld Lesch, NP       And   LORazepam (ATIVAN) injection 2 mg  2 mg Intramuscular TID PRN Jearld Lesch, NP       FLUoxetine (PROZAC) capsule 10 mg  10 mg Oral Daily Verner Chol, MD   10 mg at 07/23/23 1478   hydrOXYzine (ATARAX) tablet 25 mg  25 mg Oral TID PRN Jearld Lesch, NP       magnesium hydroxide (MILK OF MAGNESIA) suspension 30 mL  30 mL Oral Daily PRN Jearld Lesch, NP   30 mL at 07/22/23 0912   pantoprazole (PROTONIX) EC tablet 40 mg  40 mg Oral Daily Dixon, Rashaun M, NP   40 mg at 07/23/23 0835   simvastatin (ZOCOR) tablet 10 mg  10 mg Oral Daily Lerry Liner M, NP   10 mg at 07/23/23 2127   traZODone (DESYREL) tablet 50 mg  50 mg Oral QHS PRN Jearld Lesch, NP   50 mg at 07/23/23 2127    Lab Results: No results found for this or any previous visit (from the past 48 hours).  Blood Alcohol level:  Lab Results  Component Value Date   ETH 111 (H) 07/20/2023    Metabolic Disorder Labs: No results found for: "HGBA1C", "MPG" No results found for: "PROLACTIN" No results found for: "CHOL", "TRIG", "HDL", "CHOLHDL", "VLDL", "LDLCALC"  Physical Findings: AIMS:  , ,  ,  ,    CIWA:    COWS:     Musculoskeletal: Strength & Muscle Tone: {desc; muscle tone:32375} Gait & Station: {PE GAIT ED GNFA:21308} Patient leans: {Patient Leans:21022755}  Psychiatric Specialty Exam:  Presentation  General Appearance:  Appropriate for Environment; Well Groomed  Eye Contact: Fair  Speech: Clear and Coherent  Speech  Volume: Normal  Handedness: Right   Mood and Affect  Mood: Anxious  Affect: Constricted  Thought Process  Thought Processes: Coherent  Descriptions of Associations:Intact  Orientation:Full (Time, Place and Person)  Thought Content:Illogical  History of Schizophrenia/Schizoaffective disorder:No data recorded Duration of Psychotic Symptoms:No data recorded Hallucinations:Hallucinations: None  Ideas of Reference:None  Suicidal Thoughts:Suicidal Thoughts: No  Homicidal Thoughts:Homicidal Thoughts: No   Sensorium  Memory: Immediate Fair; Recent Fair; Remote Fair  Judgment: Fair  Insight: Fair   Art therapist  Concentration: Fair  Attention Span: Fair  Recall: [employer] of Knowledge: Fair  Language: Fair   Psychomotor Activity  Psychomotor Activity: Psychomotor Activity: Normal   Assets  Assets: Communication Skills; Desire for Improvement; Resilience   Sleep  Sleep: Sleep: Fair    Physical Exam: Physical Exam Vitals and nursing note reviewed.  HENT:     Head: Normocephalic.     Nose: Nose normal.     Mouth/Throat:     Mouth: Mucous membranes are moist.     Pharynx: Oropharynx is clear.  Eyes:     Extraocular Movements: Extraocular movements intact.  Cardiovascular:     Rate and Rhythm: Normal rate.     Pulses: Normal pulses.  Pulmonary:     Effort: Pulmonary effort is normal.  Abdominal:     Comments: deferred  Genitourinary:    Comments: deferred Musculoskeletal:        General: Normal range of motion.     Cervical back: Normal range of motion and neck supple.  Skin:    General: Skin is warm.  Neurological:     General: No focal deficit present.     Mental Status: She is alert and oriented to person, place, and time.  Psychiatric:        Mood and Affect: Mood normal.        Behavior: Behavior normal.        Thought Content: Thought content normal.    ROS Blood pressure (!) 152/89, pulse 92,  temperature 97.7 F (36.5 C), resp. rate 17, height 5\' 4"  (1.626 m), weight 56.2 kg, SpO2 100%. Body mass index is 21.28 kg/m.   Treatment Plan Summary: {CHL [facility] MD [place]. SAYT:016010932}  Cecilie Lowers, FNP 07/23/2023, 10:03 PM

## 2023-07-23 NOTE — Group Note (Signed)
LCSW Group Therapy Note   Group Date: 07/23/2023 Start Time: 1400 End Time: 1505   Type of Therapy and Topic:  Group Therapy: Challenging Core Beliefs  Participation Level:  Did Not Attend  Description of Group:  Patients were educated about core beliefs and asked to identify one harmful core belief that they have. Patients were asked to explore from where those beliefs originate. Patients were asked to discuss how those beliefs make them feel and the resulting behaviors of those beliefs. They were then be asked if those beliefs are true and, if so, what evidence they have to support them. Lastly, group members were challenged to replace those negative core beliefs with helpful beliefs.   Therapeutic Goals:   1. Patient will identify harmful core beliefs and explore the origins of such beliefs. 2. Patient will identify feelings and behaviors that result from those core beliefs. 3. Patient will discuss whether such beliefs are true. 4.  Patient will replace harmful core beliefs with helpful ones.  Summary of Patient Progress:  Patient did not attend group.   Therapeutic Modalities: Cognitive Behavioral Therapy; Solution-Focused Therapy   Lowry Ram, LCSWA 07/23/2023  3:10 PM

## 2023-07-23 NOTE — Plan of Care (Signed)
  Problem: Education: Goal: Knowledge of [place] [facility] Education information/materials will improve Outcome: Progressing Goal: Emotional status will improve Outcome: Progressing Goal: Mental status will improve Outcome: Progressing Goal: Verbalization of understanding the information provided will improve Outcome: Progressing   Problem: Activity: Goal: Interest or engagement in activities will improve Outcome: Progressing Goal: Sleeping patterns will improve Outcome: Progressing   Problem: Coping: Goal: Ability to verbalize frustrations and anger appropriately will improve Outcome: Progressing   Problem: Coping: Goal: Ability to demonstrate self-control will improve Outcome: Progressing   Problem: Health Behavior/Discharge Planning: Goal: Identification of resources available to assist in meeting health care needs will improve Outcome: Progressing Goal: Compliance with treatment plan for underlying cause of condition will improve Outcome: Progressing   Problem: Physical Regulation: Goal: Ability to maintain clinical measurements within normal limits will improve Outcome: Progressing   Problem: Safety: Goal: Periods of time without injury will increase Outcome: Progressing
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
## 2023-07-23 NOTE — Group Note (Signed)
Date:  07/23/2023 Time:  10:20 AM  Group Topic/Focus:  Goals Group:   The focus of this group is to help patients establish daily goals to achieve during treatment and discuss how the patient can incorporate goal setting into their daily lives to aide in recovery. Orientation:   The focus of this group is to educate the patient on the purpose and policies of crisis stabilization and provide a format to answer questions about their admission.  The group details unit policies and expectations of patients while admitted. Pop QUIZ: Tabitha Tucker    Participation Level:  Active  Participation Quality:  Appropriate  Affect:  Appropriate  Cognitive:  Alert, Appropriate, and Oriented  Insight: Appropriate  Engagement in Group:  Developing/Improving and Engaged  Modes of Intervention:  Activity  Additional Comments:    Tabitha Tucker 07/23/2023, 10:20 AM

## 2023-07-23 NOTE — Group Note (Signed)
Date:  07/23/2023 Time:  12:42 PM  Group Topic/Focus:  Rediscovering Joy:   The focus of this group is to explore various ways to relieve stress in a positive manner. Art group therapy    Participation Level:  Active  Participation Quality:  Appropriate  Affect:  Appropriate  Cognitive:  Appropriate  Insight: Appropriate  Engagement in Group:  Developing/Improving  Modes of Intervention:  Activity  Additional Comments:    Levan Aloia 07/23/2023, 12:42 PM

## 2023-07-23 NOTE — Group Note (Signed)
Date:  07/23/2023 Time:  8:49 PM  Group Topic/Focus:  Wellness Toolbox:   The focus of this group is to discuss various aspects of wellness, balancing those aspects and exploring ways to increase the ability to experience wellness.  Patients will create a wellness toolbox for use upon discharge. Wrap-Up Group:   The focus of this group is to help patients review their daily goal of treatment and discuss progress on daily workbooks.    Participation Level:  Active  Participation Quality:  Appropriate and Attentive  Affect:  Appropriate  Cognitive:  Alert and Appropriate  Insight: Appropriate  Engagement in Group:  Engaged  Modes of Intervention:  Discussion and Orientation  Additional Comments:     Maglione,Meiya Wisler E 07/23/2023, 8:49 PM

## 2023-07-23 NOTE — Group Note (Addendum)
Recreation Therapy Group Note   Group Topic:Health and Wellness  Group Date: 07/23/2023 Start Time: 1000 End Time: 1050 Facilitators: Rosina Lowenstein, LRT, CTRS Location:  Craft Room  Activity Description/Intervention: Therapeutic Drumming. Patients with peers and staff were given the opportunity to engage in a leader facilitated HealthRHYTH[facility] Group Empowerment Drumming Circle with staff from the [employer], in partnership with [employer]. Teaching laboratory technician and trained [employer], Theodoro Doing leading with LRT observing and documenting intervention and pt response. This evidenced-based practice targets 7 areas of health and wellbeing in the human experience including: stress-reduction, exercise, self-expression, camaraderie/support, nurturing, spirituality, and music-making (leisure).   Goal Area(s) Addresses:  Patient will engage in pro-social way in music group.  Patient will follow directions of drum leader on the first prompt. Patient will demonstrate no behavioral issues during group.  Patient will identify if a reduction in stress level occurs as a result of participation in therapeutic drum circle.    Education: Leisure exposure, Pharmacologist, Musical expression, Discharge Planning   Clinical Observations/Individualized Feedback: Bhavika actively engaged in therapeutic drumming exercise and discussions. Pt was appropriate with peers, staff, and musical equipment for duration of programming.  Pt identified "exuberant" as their feeling after participation in music-based programming. Pt affect congruent with verbalized emotion.    Plan: Continue to engage patient in RT group sessions 2-3x/week.   Rosina Lowenstein, LRT, CTRS 07/23/2023 1:59 PM

## 2023-07-23 NOTE — Progress Notes (Signed)
   07/23/23 1100  Psych Admission Type (Psych Patients Only)  Admission Status Involuntary  Psychosocial Assessment  Patient Complaints Anxiety  Eye Contact Other (Comment) (WNL)  Facial Expression Other (Comment) (WNL)  Affect Anxious  Speech Logical/coherent  Interaction Attention-seeking  Motor Activity Other (Comment) (WNL)  Appearance/Hygiene Unremarkable  Behavior Characteristics Cooperative;Appropriate to situation  Mood Pleasant  Thought Process  Coherency WDL  Content WDL  Delusions None reported or observed  Perception WDL  Hallucination None reported or observed  Judgment WDL  Confusion None  Danger to Self  Current suicidal ideation? Denies  Agreement Not to Harm Self Yes  Description of Agreement verbal  Danger to Others  Danger to Others None reported or observed

## 2023-07-24 DIAGNOSIS — F1020 Alcohol dependence, uncomplicated: Secondary | ICD-10-CM | POA: Insufficient documentation

## 2023-07-24 DIAGNOSIS — F332 Major depressive disorder, recurrent severe without psychotic features: Secondary | ICD-10-CM | POA: Diagnosis not present

## 2023-07-24 DIAGNOSIS — F1210 Cannabis abuse, uncomplicated: Secondary | ICD-10-CM | POA: Insufficient documentation

## 2023-07-24 MED ORDER — TRAZODONE HCL 50 MG PO TABS: 50.0000 mg | ORAL_TABLET | Freq: Every evening | ORAL | 0 refills | Status: DC | PRN

## 2023-07-24 MED ORDER — HYDROXYZINE HCL 25 MG PO TABS: 25.0000 mg | ORAL_TABLET | Freq: Three times a day (TID) | ORAL | 0 refills | Status: AC | PRN

## 2023-07-24 MED ORDER — PROPRANOLOL HCL 20 MG PO TABS
10.0000 mg | ORAL_TABLET | Freq: Two times a day (BID) | ORAL | Status: DC
  Administered 2023-07-24: 10 mg via ORAL
  Filled 2023-07-24: qty 1

## 2023-07-24 MED ORDER — FLUOXETINE HCL 20 MG PO CAPS: 20.0000 mg | ORAL_CAPSULE | Freq: Every day | ORAL | Status: DC

## 2023-07-24 MED ORDER — PROPRANOLOL HCL 10 MG PO TABS: 10.0000 mg | ORAL_TABLET | Freq: Every day | ORAL | 0 refills | Status: DC

## 2023-07-24 MED ORDER — FLUOXETINE HCL 20 MG PO CAPS: 20.0000 mg | ORAL_CAPSULE | Freq: Every day | ORAL | 0 refills | Status: DC
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
## 2023-07-24 NOTE — Plan of Care (Signed)
  Problem: Education: Goal: Knowledge of [facility] Education information/materials will improve Outcome: Progressing Goal: Emotional status will improve Outcome: Progressing Goal: Mental status will improve Outcome: Progressing Goal: Verbalization of understanding the information provided will improve Outcome: Progressing   Problem: Activity: Goal: Interest or engagement in activities will improve Outcome: Progressing Goal: Sleeping patterns will improve Outcome: Progressing   Problem: Coping: Goal: Ability to verbalize frustrations and anger appropriately will improve Outcome: Progressing Goal: Ability to demonstrate self-control will improve Outcome: Progressing   Problem: Health Behavior/Discharge Planning: Goal: Identification of resources available to assist in meeting health care needs will improve Outcome: Progressing Goal: Compliance with treatment plan for underlying cause of condition will improve Outcome: Progressing   Problem: Physical Regulation: Goal: Ability to maintain clinical measurements within normal limits will improve Outcome: Progressing   Problem: Safety: Goal: Periods of time without injury will increase Outcome: Progressing   Problem: Education: Goal: Ability to state activities that reduce stress will improve Outcome: Progressing   Problem: Coping: Goal: Ability to identify and develop effective coping behavior will improve Outcome: Progressing   Problem: Self-Concept: Goal: Ability to identify factors that promote anxiety will improve Outcome: Progressing Goal: Level of anxiety will decrease Outcome: Progressing Goal: Ability to modify response to factors that promote anxiety will improve Outcome: Progressing   Problem: Education: Goal: Ability to make informed decisions regarding treatment will improve Outcome: Progressing   Problem: Coping: Goal: Coping ability will improve Outcome: Progressing   Problem: Health  Behavior/Discharge Planning: Goal: Identification of resources available to assist in meeting health care needs will improve Outcome: Progressing   Problem: Medication: Goal: Compliance with prescribed medication regimen will improve Outcome: Progressing   Problem: Self-Concept: Goal: Ability to disclose and discuss suicidal ideas will improve Outcome: Progressing Goal: Will verbalize positive feelings about self Outcome: Progressing Note: Patient is on track. Patient will maintain adherence, adhere to provider and/or lab appointments, avoid flare triggers, and be monitored by provider to determine if a change in treatment plan is warranted    Problem: Education: Goal: Knowledge of General Education information will improve Description: Including pain rating scale, medication(s)/side effects and non-pharmacologic comfort measures Outcome: Progressing   Problem: Health Behavior/Discharge Planning: Goal: Ability to manage health-related needs will improve Outcome: Progressing   Problem: Clinical Measurements: Goal: Ability to maintain clinical measurements within normal limits will improve Outcome: Progressing Goal: Will remain free from infection Outcome: Progressing Goal: Diagnostic test results will improve Outcome: Progressing Goal: Respiratory complications will improve Outcome: Progressing Goal: Cardiovascular complication will be avoided Outcome: Progressing   Problem: Activity: Goal: Risk for activity intolerance will decrease Outcome: Progressing   Problem: Nutrition: Goal: Adequate nutrition will be maintained Outcome: Progressing   Problem: Coping: Goal: Level of anxiety will decrease Outcome: Progressing   Problem: Elimination: Goal: Will not experience complications related to bowel motility Outcome: Progressing Goal: Will not experience complications related to urinary retention Outcome: Progressing   Problem: Pain Managment: Goal: General  experience of comfort will improve and/or be controlled Outcome: Progressing   Problem: Safety: Goal: Ability to remain free from injury will improve Outcome: Progressing   Problem: Skin Integrity: Goal: Risk for impaired skin integrity will decrease Outcome: Progressing

## 2023-07-24 NOTE — Progress Notes (Signed)
   07/23/23 2125  Psych Admission Type (Psych Patients Only)  Admission Status Involuntary  Psychosocial Assessment  Patient Complaints None  Eye Contact Brief  Facial Expression Animated  Affect Other (Comment) (WNL)  Speech Logical/coherent  Interaction Attention-seeking  Motor Activity Other (Comment) (WNL)  Appearance/Hygiene Unremarkable  Behavior Characteristics Cooperative;Appropriate to situation  Mood Pleasant  Aggressive Behavior  Effect No apparent injury  Thought Process  Coherency WDL  Content WDL  Delusions None reported or observed  Perception WDL  Hallucination None reported or observed  Judgment WDL  Confusion None  Danger to Self  Current suicidal ideation? Denies  Agreement Not to Harm Self Yes  Description of Agreement Verbal  Danger to Others  Danger to Others None reported or observed

## 2023-07-24 NOTE — Plan of Care (Signed)
  Problem: Education: Goal: Knowledge of [facility] Education information/materials will improve Outcome: Adequate for Discharge Goal: Emotional status will improve Outcome: Adequate for Discharge Goal: Mental status will improve Outcome: Adequate for Discharge Goal: Verbalization of understanding the information provided will improve Outcome: Adequate for Discharge   Problem: Activity: Goal: Interest or engagement in activities will improve Outcome: Adequate for Discharge Goal: Sleeping patterns will improve Outcome: Adequate for Discharge   Problem: Coping: Goal: Ability to verbalize frustrations and anger appropriately will improve Outcome: Adequate for Discharge Goal: Ability to demonstrate self-control will improve Outcome: Adequate for Discharge   
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
## 2023-07-24 NOTE — [facility] Suicide Risk Assessment (Signed)
[facility] INPATIENT:  Family/Significant Other Suicide Prevention Education  Suicide Prevention Education:  Education Completed; Errika Herald husband [phone] has been identified by the patient as the family member/significant other with whom the patient will be residing, and identified as the person(s) who will aid the patient in the event of a mental health crisis (suicidal ideations/suicide attempt).  With written consent from the patient, the family member/significant other has been provided the following suicide prevention education, prior to the and/or following the discharge of the patient.  The suicide prevention education provided includes the following: Suicide risk factors Suicide prevention and interventions National Suicide Hotline telephone number [facility] assessment telephone number [facility] Emergency Assistance 911 [facility] and/or Residential Mobile Crisis Unit telephone number  Request made of family/significant other to: Remove weapons (e.g., guns, rifles, knives), all items previously/currently identified as safety concern.   Remove drugs/medications (over-the-counter, prescriptions, illicit drugs), all items previously/currently identified as a safety concern.  The family member/significant other verbalizes understanding of the suicide prevention education information provided.  The family member/significant other agrees to remove the items of safety concern listed above.  Harden Mo 07/24/2023, 9:34 AM

## 2023-07-24 NOTE — Group Note (Signed)
Date:  07/24/2023 Time:  12:48 PM  Group Topic/Focus:  Goals Group:   The focus of this group is to help patients establish daily goals to achieve during treatment and discuss how the patient can incorporate goal setting into their daily lives to aide in recovery. Managing Feelings:   The focus of this group is to identify what feelings patients have difficulty handling and develop a plan to handle them in a healthier way upon discharge.    Participation Level:  Active  Participation Quality:  Appropriate  Affect:  Appropriate  Cognitive:  Alert  Insight: Appropriate  Engagement in Group:  Developing/Improving and Engaged  Modes of Intervention:  Activity  Additional Comments:    Pleas Carneal 07/24/2023, 12:48 PM

## 2023-07-24 NOTE — Progress Notes (Signed)
Discharge Note:  Patient denies SI/HI/AVH at this time. Discharge instructions, AVS, prescriptions, and transition record reviewed ith patient. Patient agrees to comply with medication management, follow-up visit, and outpatient therapy. Patient belongings returned to patient. Patient questions and concerns addressed and answerd. Patient ambulatory off unit @ 1410. Patient discharged to home with spouse escort.
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
## 2023-07-24 NOTE — Progress Notes (Signed)
  [facility] Adult Case Management Discharge Plan :  Will you be returning to the same living situation after discharge:  Yes,  PT IS RETURNING HOME At discharge, do you have transportation home?: Yes,  pt husband will provide transportation. Do you have the ability to pay for your medications: No.  No insurance noted.  Release of information consent forms completed and in the chart;  Patient's signature needed at discharge.  Patient to Follow up at:  Follow-up Information     [facility], [facility]. Go to.   Why: In person appointment made for 07/27/23 at 8 AM. Contact information: [address] [place] [address] [phone]                 Next level of care provider has access to [facility] Link:no  Safety Planning and Suicide Prevention discussed: Yes,  SPE completed with the patient.      Has patient been referred to the Quitline?: Patient does not use tobacco/nicotine products  Patient has been referred for addiction treatment: No known substance use disorder.  Harden Mo, LCSW 07/24/2023, 10:11 AM
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
## 2023-07-25 ENCOUNTER — Ambulatory Visit: Payer: BC Managed Care – PPO | Admitting: Dermatology

## 2023-08-22 ENCOUNTER — Ambulatory Visit: Payer: 59 | Admitting: Dermatology

## 2023-08-22 DIAGNOSIS — Z86018 Personal history of other benign neoplasm: Secondary | ICD-10-CM

## 2023-08-22 DIAGNOSIS — D1801 Hemangioma of skin and subcutaneous tissue: Secondary | ICD-10-CM | POA: Diagnosis not present

## 2023-08-22 DIAGNOSIS — D229 Melanocytic nevi, unspecified: Secondary | ICD-10-CM

## 2023-08-22 DIAGNOSIS — L821 Other seborrheic keratosis: Secondary | ICD-10-CM | POA: Diagnosis not present

## 2023-08-22 NOTE — Patient Instructions (Addendum)
 Mole A mole is a colored (pigmented) growth on the skin. Moles are very common. They are usually harmless, but some moles can become cancerous over time. What are the causes? Moles are caused when pigmented skin cells grow together in clusters instead of spreading out in the skin as they normally do. The reason why the skin cells grow together in clusters is not known. What increases the risk? You are more likely to develop a mole if: You have family members who have moles. You are fair skinned. You have red or blond hair. You are often outdoors and exposed to the sun. You received phototherapy when you were a newborn baby. You are female. What are the signs or symptoms? A mole may occur anywhere on your skin. A mole may be: [place] or another color. Although moles are most often brown, they can also be tan, black, red, pink, blue, skin-toned, or colorless. Flat or raised. Smooth or wrinkled. Round in shape. How is this diagnosed? A mole is diagnosed with a skin exam. If your health care provider thinks a mole may be cancerous, all or part of the mole will be removed for testing (biopsy). How is this treated? Most moles are noncancerous (benign) and do not require treatment. If a mole is found to be cancerous, it will be removed. You may also choose to have a mole removed if it is causing pain or if you do not like the way it looks. Follow these instructions at home: General instructions  Every month, look for new moles and check your existing moles for changes. This is important because a change in a mole can mean that the mole has become cancerous. ABCDE changes in a mole indicate that you should be evaluated by your health care provider. ABCDE stands for: Asymmetry. This means the mole has an irregular shape. It is not round or oval. Border. This means the mole has an irregular or bumpy border. Color. This means the mole has multiple colors in it, including brown, black, blue, red, or  tan. Note that it is normal for moles to get darker when a woman is pregnant or takes birth control pills. Diameter. This means the mole is more than 0.2 inches (6 mm) across. Evolving. This refers to any unusual changes or symptoms in the mole, such as pain, itching, stinging, sensitivity, or bleeding. If you have a large number of moles, see a skin doctor (dermatologist) at least one time every year for a full-body skin check. Lifestyle  When you are outdoors, wear sunscreen with SPF 30 (sun protection factor 30) or higher. Use an adequate amount of sunscreen to cover exposed areas of skin. Put it on 30 minutes before you go out. Reapply it every 2 hours or anytime you come out of the water. When you are out in the sun, wear a broad-brimmed hat and clothing that covers your arms and legs. Wear wraparound sunglasses. Contact a health care provider if: The size, shape, borders, or color of your mole changes. Your mole, or the skin near the mole, becomes painful, sore, red, or swollen. Your mole: Develops more than one color. Itches or bleeds. Becomes scaly, sheds skin, or oozes fluid. Becomes flat or develops raised areas. Becomes hard or soft. You develop a new mole. Summary A mole is a colored (pigmented) growth on the skin. Moles are very common. They are usually harmless, but some moles can become cancerous over time. Every month, look for new moles and check your  existing moles for changes. This is important because a change in a mole can mean that the mole has become cancerous. If you have a large number of moles, see a skin doctor (dermatologist) at least one time every year for a full-body skin check. When you are outdoors, wear sunscreen with SPF 30 (sun protection factor 30) or higher. Reapply it every 2 hours or anytime you come out of the water. Contact a health care provider if you notice changes in a mole or if you develop a new mole. This information is not intended to replace  advice given to you by your health care provider. Make sure you discuss any questions you have with your health care provider. Document Revised: 03/10/2021 Document Reviewed: 03/10/2021 Elsevier Patient Education  2024 [employer].     Due to recent changes in healthcare laws, you may see results of your pathology and/or laboratory studies on MyChart before the doctors have had a chance to review them. We understand that in some cases there may be results that are confusing or concerning to you. Please understand that not all results are received at the same time and often the doctors may need to interpret multiple results in order to provide you with the best plan of care or course of treatment. Therefore, we ask that you please give [place] 2 business days to thoroughly review all your results before contacting the office for clarification. Should we see a critical lab result, you will be contacted sooner.   If You Need Anything After Your Visit  If you have any questions or concerns for your doctor, please call our main line at [phone] and press option 4 to reach your doctor's medical assistant. If no one answers, please leave a voicemail as directed and we will return your call as soon as possible. Messages left after 4 pm will be answered the following business day.   You may also send [place] a message via MyChart. We typically respond to MyChart messages within 1-2 business days.  For prescription refills, please ask your pharmacy to contact our office. Our fax number is [phone].  If you have an urgent issue when the clinic is closed that cannot wait until the next business day, you can page your doctor at the number below.    Please note that while we do our best to be available for urgent issues outside of office hours, we are not available 24/7.   If you have an urgent issue and are unable to reach [place], you may choose to seek medical care at your doctor's office, retail clinic,  urgent care center, or emergency room.  If you have a medical emergency, please immediately call 911 or go to the emergency department.  Pager Numbers  - Dr. Gwen Pounds: [phone]  - Dr. Roseanne Reno: [phone]  - Dr. Katrinka Blazing: [phone]   In the event of inclement weather, please call our main line at [phone] for an update on the status of any delays or closures.  Dermatology Medication Tips: Please keep the boxes that topical medications come in in order to help keep track of the instructions about where and how to use these. Pharmacies typically print the medication instructions only on the boxes and not directly on the medication tubes.   If your medication is too expensive, please contact our office at [phone] option 4 or send [place] a message through MyChart.   We are unable to tell what your co-pay for medications will be in advance as  this is different depending on your insurance coverage. However, we may be able to find a substitute medication at lower cost or fill out paperwork to get insurance to cover a needed medication.   If a prior authorization is required to get your medication covered by your insurance company, please allow [place] 1-2 business days to complete this process.  Drug prices often vary depending on where the prescription is filled and some pharmacies may offer cheaper prices.  The website www.goodrx.com contains coupons for medications through different pharmacies. The prices here do not account for what the cost may be with help from insurance (it may be cheaper with your insurance), but the website can give you the price if you did not use any insurance.  - You can print the associated coupon and take it with your prescription to the pharmacy.  - You may also stop by our office during regular business hours and pick up a GoodRx coupon card.  - If you need your prescription sent electronically to a different pharmacy, notify our office through [facility] or by phone at [phone] option 4.     Si Usted Necesita Algo Despus de Su Visita  Tambin puede enviarnos un mensaje a travs de Clinical cytogeneticist. Por lo general respondemos a los mensajes de MyChart en el transcurso de 1 a 2 das hbiles.  Para renovar recetas, por favor pida a su farmacia que se ponga en contacto con nuestra oficina. Annie Sable de fax es [place] [phone].  Si tiene un asunto urgente cuando la clnica est cerrada y que no puede esperar hasta el siguiente da hbil, puede llamar/localizar a su doctor(a) al nmero que aparece a continuacin.   Por favor, tenga en cuenta que aunque hacemos todo lo posible para estar disponibles para asuntos urgentes fuera del horario de [place], no estamos disponibles las 24 horas del da, los 7 [address]  [address] de la [place].   Si tiene un problema urgente y no puede comunicarse con nosotros, puede optar por buscar atencin mdica  en el consultorio de su doctor(a), en una clnica privada, en un centro de atencin urgente o en una sala de emergencias.  Si tiene Engineer, drilling, por favor llame inmediatamente al 911 o vaya a la sala de emergencias.  Nmeros de bper  - Dr. Gwen Pounds: [phone]  - Dra. Roseanne Reno: [phone]  - Dr. Katrinka Blazing: [phone]   En caso de inclemencias del tiempo, por favor llame a Lacy Duverney principal al [phone] para una actualizacin sobre el [place] de cualquier retraso o cierre.  Consejos para la medicacin en dermatologa: Por favor, guarde las cajas en las que vienen los medicamentos de uso tpico para ayudarle a seguir las instrucciones sobre dnde y cmo usarlos. Las farmacias generalmente imprimen las instrucciones del medicamento slo en las cajas y no directamente en los tubos del [place].   Si su medicamento es muy caro, por favor, pngase en contacto con Rolm Gala llamando al [phone] y presione la opcin 4 o envenos un mensaje a travs de Clinical cytogeneticist.   No podemos decirle cul  ser su copago por los medicamentos por adelantado ya que esto es diferente dependiendo de la cobertura de su seguro. Sin embargo, es posible que podamos encontrar un medicamento sustituto a Audiological scientist un formulario para que el seguro cubra el medicamento que se considera necesario.   Si se requiere una autorizacin previa para que su compaa de seguros [place] su medicamento, por favor permtanos de 1 a 2 [address]  [address]  hbiles para completar este proceso.  Los precios de los medicamentos varan con frecuencia dependiendo del Environmental consultant de dnde se surte la receta y alguna farmacias pueden ofrecer precios ms baratos.  El sitio web www.goodrx.com tiene cupones para medicamentos de Health and safety inspector. Los precios aqu no tienen en cuenta lo que podra costar con la ayuda del seguro (puede ser ms barato con su seguro), pero el sitio web puede darle el precio si no utiliz Tourist information centre manager.  - Puede imprimir el cupn correspondiente y llevarlo con su receta a la farmacia.  - Tambin puede pasar por nuestra oficina durante el horario de atencin regular y Education officer, museum una tarjeta de cupones de GoodRx.  - Si necesita que su receta se enve electrnicamente a una farmacia diferente, informe a nuestra oficina a travs de MyChart de [place] o por telfono llamando al [phone] y presione la opcin 4.

## 2023-08-22 NOTE — Progress Notes (Signed)
   Follow-Up Visit   Subjective  Tabitha Tucker is a 63 y.o. female who presents for the following: Recheck bx proven severely dysplastic nevus of the RUQA periumbilical.   The following portions of the chart were reviewed this encounter and updated as appropriate: medications, allergies, medical history  Review of Systems:  No other skin or systemic complaints except as noted in HPI or Assessment and Plan.  Objective  Well appearing patient in no apparent distress; mood and affect are within normal limits.   A focused examination was performed of the following areas: the face and abdomen   Relevant exam findings are noted in the Assessment and Plan.    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  Call if any new or changing lesions are noted between office visits  MELANOCYTIC NEVI Exam: Tan-brown and/or pink-flesh-colored symmetric macules and papules  Treatment Plan: Benign appearing on exam today. Recommend observation. Call clinic for new or changing moles. Recommend daily use of broad spectrum spf 30+ sunscreen to sun-exposed areas.   HEMANGIOMA Exam: red papule(s) Discussed benign nature. Recommend observation. Call for changes.  SEBORRHEIC KERATOSIS - Stuck-on, waxy, tan-brown papules and/or plaques  - Benign-appearing - Discussed benign etiology and prognosis. - Observe - Call for any changes   Return for appointment as scheduled.  Maylene Roes, CMA, am acting as scribe for Willeen Niece, MD .   Documentation: I have reviewed the above documentation for accuracy and completeness, and I agree with the above.  Willeen Niece, MD

## 2023-10-03 ENCOUNTER — Other Ambulatory Visit: Payer: Self-pay | Admitting: Oncology

## 2023-10-04 ENCOUNTER — Other Ambulatory Visit: Payer: Self-pay

## 2023-10-04 MED ORDER — ANASTROZOLE 1 MG PO TABS: 1.0000 mg | ORAL_TABLET | Freq: Every day | ORAL | 2 refills | Status: DC

## 2023-12-05 ENCOUNTER — Inpatient Hospital Stay: Payer: BC Managed Care – PPO | Attending: Oncology | Admitting: Oncology

## 2023-12-05 ENCOUNTER — Telehealth: Payer: Self-pay

## 2023-12-05 ENCOUNTER — Encounter: Payer: Self-pay | Admitting: Oncology

## 2023-12-05 VITALS — BP 124/73 | HR 83 | Temp 98.1°F | Resp 18 | Ht 64.0 in | Wt 128.0 lb

## 2023-12-05 DIAGNOSIS — C50412 Malignant neoplasm of upper-outer quadrant of left female breast: Secondary | ICD-10-CM | POA: Insufficient documentation

## 2023-12-05 DIAGNOSIS — Z08 Encounter for follow-up examination after completed treatment for malignant neoplasm: Secondary | ICD-10-CM

## 2023-12-05 DIAGNOSIS — C773 Secondary and unspecified malignant neoplasm of axilla and upper limb lymph nodes: Secondary | ICD-10-CM | POA: Insufficient documentation

## 2023-12-05 DIAGNOSIS — Z90710 Acquired absence of both cervix and uterus: Secondary | ICD-10-CM | POA: Diagnosis not present

## 2023-12-05 DIAGNOSIS — Z170 Estrogen receptor positive status [ER+]: Secondary | ICD-10-CM | POA: Insufficient documentation

## 2023-12-05 DIAGNOSIS — Z86018 Personal history of other benign neoplasm: Secondary | ICD-10-CM | POA: Insufficient documentation

## 2023-12-05 DIAGNOSIS — Z79811 Long term (current) use of aromatase inhibitors: Secondary | ICD-10-CM | POA: Diagnosis not present

## 2023-12-05 DIAGNOSIS — Z79899 Other long term (current) drug therapy: Secondary | ICD-10-CM

## 2023-12-05 DIAGNOSIS — Z5181 Encounter for therapeutic drug level monitoring: Secondary | ICD-10-CM

## 2023-12-05 NOTE — Progress Notes (Signed)
 Secure chat sent to breast navigator A. Moore asking for assistance obtaining the out-of-pocket cost for BCI testing; patient does not have Medicaid and expressed to Dr. Randy Buttery she is willing to pay out of pocket.

## 2023-12-05 NOTE — Telephone Encounter (Signed)
 Per Dr. Randy Buttery patient would like BCI testing done regardless of out of pocket cost (patient does not have Medicaid).  Dr. Randy Buttery would like us  to find out what the cost will be for the patient. Have been coordinating with Gwenn Lenz and per her last secure chat "I tried to email their customer service to see if they can give us  an estimated max out of pocket. It says they received the inquiry and will respond back within next business day.Aaron AasAaron AasAaron AasI'm off tomorrow, Friday and Monday. We can try to call and see if we can get an answer."  Lindaann Requena also mentioned "So when I google, it says many insurance companies pay but they offer financial assistance program if certain criteria are met."  Will attempt contacting tomorrow via phone to obtain cost break down.

## 2023-12-05 NOTE — Progress Notes (Signed)
 Hematology/Oncology Consult note [facility]  [phone] [phone]  Patient Care Team: Prescilla Brod, MD as PCP - General (Obstetrics and Gynecology) Avonne Boettcher, MD as Consulting Physician (Hematology and Oncology)   Name of the patient: Tabitha Tucker  [id number]  05-26-1961   Date of visit: 12/05/23  Diagnosis-history of stage I ER positive breast cancer on Arimidex   Chief complaint/ Reason for visit-routine follow-up of breast cancer on Arimidex   Heme/Onc history: patient is a 63 year old Caucasian female who underwent a screening mammogram which showed 1.7 x 1.2 x 1.6 cm hypoechoic irregular area in her left breast at 1 o'clock position.  No evidence of left axillary adenopathy.  This was 5 biopsied and was invasive mammary carcinoma grade 2, ER greater than 90% positive PR 11 to 50% positive and HER-2/neu negative.  Patient underwent lumpectomy with sentinel lymph node biopsy on 07/25/2018.  Final pathology showed invasive mammary carcinoma 1.7 cm, grade 2 with negative margins.  2 out of 2 sentinel lymph nodes were positive for malignancy.  One lymph node was involved by micrometastatic carcinoma 1.5 mm and the other lymph node was involved with macro metastatic disease 5 mm.  There was focal microscopy extracapsular extension present which I verified with Dr. Brunetta Capes from pathology.  pT1c pN1A.  Systemic CT scans denied by insurance.  She completed adjuvant radiation treatment   Patient underwent MammaPrint testing which came back as low risk with no significant benefit from adjuvant chemotherapy.  Patient started taking Arimidex  in May 2020.      Interval history-patient is currently on Arimidex  and is otherwise tolerating it well other than concerns for hair loss.  She would like to do the BCI testing to decide if she needs to continue Arimidex  for 5 more years or stop at 5 years  ECOG PS- 0 Pain scale- 0   Review of systems-  Review of Systems  Constitutional:  Negative for chills, fever, malaise/fatigue and weight loss.  HENT:  Negative for congestion, ear discharge and nosebleeds.   Eyes:  Negative for blurred vision.  Respiratory:  Negative for cough, hemoptysis, sputum production, shortness of breath and wheezing.   Cardiovascular:  Negative for chest pain, palpitations, orthopnea and claudication.  Gastrointestinal:  Negative for abdominal pain, blood in stool, constipation, diarrhea, heartburn, melena, nausea and vomiting.  Genitourinary:  Negative for dysuria, flank pain, frequency, hematuria and urgency.  Musculoskeletal:  Negative for back pain, joint pain and myalgias.  Skin:  Negative for rash.  Neurological:  Negative for dizziness, tingling, focal weakness, seizures, weakness and headaches.  Endo/Heme/Allergies:  Does not bruise/bleed easily.  Psychiatric/Behavioral:  Negative for depression and suicidal ideas. The patient does not have insomnia.       Allergies  Allergen Reactions   Shellfish Allergy      Past Medical History:  Diagnosis Date   Breast cancer (HCC) 07/2018   Dysplastic nevus 01/11/2023   RUQA periumbilical - severe, but margins free, recheck in 6 mths   High cholesterol    Panic attacks    Personal history of radiation therapy    PONV (postoperative nausea and vomiting)      Past Surgical History:  Procedure Laterality Date   ABDOMINAL HYSTERECTOMY     pt did keep ovaries   APPENDECTOMY     ate age 40   BREAST BIOPSY Left 07/10/2018   INVASIVE MAMMARY CARCINOMA   BREAST LUMPECTOMY Left 07/25/2018   [facility],    BREAST LUMPECTOMY WITH  NEEDLE LOCALIZATION AND AXILLARY SENTINEL LYMPH NODE BX Left 07/25/2018   Procedure: BREAST LUMPECTOMY WITH NEEDLE LOCALIZATION AND SENTINEL NODE BX;  Surgeon: Conrado Delay, DO;  Location: [facility] ORS;  Service: General;  Laterality: Left;    Social History   Socioeconomic History   Marital status: Married    Spouse name: Not on file    Number of children: Not on file   Years of education: Not on file   Highest education level: Not on file  Occupational History   Not on file  Tobacco Use   Smoking status: Never   Smokeless tobacco: Never  Vaping Use   Vaping status: Never Used  Substance and Sexual Activity   Alcohol use: Yes    Alcohol/week: 14.0 standard drinks of alcohol    Types: 14 Glasses of wine per week    Comment: 2 glasses of wine every day   Drug use: Not Currently    Types: Marijuana    Comment: marijuana when she was a teenager a few time   Sexual activity: Yes  Other Topics Concern   Not on file  Social History Narrative   Not on file   Social Drivers of Health   Financial Resource Strain: Low Risk  (08/02/2023)   Received from [facility]   Overall Financial Resource Strain (CARDIA)    Difficulty of Paying Living Expenses: Not hard at all  Food Insecurity: No Food Insecurity (08/02/2023)   Received from [facility]   Hunger Vital Sign    Worried About Running Out of Food in the Last Year: Never true    Ran Out of Food in the Last Year: Never true  Transportation Needs: No Transportation Needs (08/02/2023)   [facility]    In the past 12 months, has lack of transportation kept you from medical appointments or from getting medications?: No    Lack of Transportation (Non-Medical): No  Physical Activity: Not on file  Stress: Not on file  Social Connections: Not on file  Intimate Partner Violence: Not At Risk (07/20/2023)   Humiliation, Afraid, Rape, and Kick questionnaire    Fear of Current or Ex-Partner: No    Emotionally Abused: No    Physically Abused: No    Sexually Abused: No    Family History  Problem Relation Age of Onset   Hypertension Father    Breast cancer Neg Hx      Current Outpatient Medications:    ALPRAZolam (XANAX) 0.25 MG tablet, Take 0.25 mg by mouth., Disp: , Rfl:     anastrozole  (ARIMIDEX ) 1 MG tablet, Take 1 tablet (1 mg total) by mouth daily., Disp: 90 tablet, Rfl: 2   B Complex Vitamins (VITAMIN B COMPLEX) TABS, Take 1 tablet by mouth daily., Disp: , Rfl:    B Complex-C (SUPER B COMPLEX PO), Take 1 tablet by mouth daily., Disp: , Rfl:    Calcium  Carbonate-Vit D-Min (CALCIUM  600+D PLUS MINERALS) 600-400 MG-UNIT TABS, Take 2 tablets by mouth daily., Disp: , Rfl:    Garlic 1000 MG CAPS, Take 1,000 mg by mouth daily., Disp: , Rfl:    pantoprazole  (PROTONIX ) 40 MG tablet, Take by mouth., Disp: , Rfl:    simvastatin  (ZOCOR ) 10 MG tablet, Take 10 mg by mouth daily., Disp: , Rfl:    traZODone  (DESYREL ) 50 MG tablet, Take 1 tablet (50 mg total) by mouth at bedtime as needed for sleep., Disp: 30 tablet, Rfl:  0   FLUoxetine  (PROZAC ) 20 MG capsule, Take 1 capsule (20 mg total) by mouth daily. (Patient not taking: Reported on 12/05/2023), Disp: 30 capsule, Rfl: 0   propranolol  (INDERAL ) 10 MG tablet, Take 1 tablet (10 mg total) by mouth daily. (Patient not taking: Reported on 12/05/2023), Disp: 30 tablet, Rfl: 0  Physical exam:  Vitals:   12/05/23 1202  BP: 124/73  Pulse: 83  Resp: 18  Temp: 98.1 F (36.7 C)  TempSrc: Tympanic  SpO2: 99%  Weight: 128 lb (58.1 kg)  Height: 5\' 4"  (1.626 m)   Physical Exam Cardiovascular:     Rate and Rhythm: Normal rate and regular rhythm.     Heart sounds: Normal heart sounds.  Pulmonary:     Effort: Pulmonary effort is normal.     Breath sounds: Normal breath sounds.  Skin:    General: Skin is warm and dry.  Neurological:     Mental Status: She is alert and oriented to person, place, and time.    Breast exam was performed in seated and lying down position. Patient is status post left lumpectomy with a well-healed surgical scar. No evidence of any palpable masses. No evidence of axillary adenopathy. No evidence of any palpable masses or lumps in the right breast. No evidence of right axillary adenopathy   I have  personally reviewed labs listed below:    Latest Ref Rng & Units 07/20/2023    3:27 AM  CMP  Glucose 70 - 99 mg/dL 409   BUN 8 - 23 mg/dL 15   Creatinine 8.11 - 1.00 mg/dL 9.14   Sodium 782 - 956 mmol/L 144   Potassium 3.5 - 5.1 mmol/L 4.3   Chloride 98 - 111 mmol/L 104   CO2 22 - 32 mmol/L 22   Calcium  8.9 - 10.3 mg/dL 9.3   Total Protein 6.5 - 8.1 g/dL 8.3   Total Bilirubin 0.0 - 1.2 mg/dL 0.8   Alkaline Phos 38 - 126 U/L 70   AST 15 - 41 U/L 34   ALT 0 - 44 U/L 20       Latest Ref Rng & Units 07/20/2023    3:27 AM  CBC  WBC 4.0 - 10.5 K/uL 16.6   Hemoglobin 12.0 - 15.0 g/dL 21.3   Hematocrit 08.6 - 46.0 % 45.5   Platelets 150 - 400 K/uL 347      Assessment and plan- Patient is a 63 y.o. female with history of stage I ER positive left breast cancer currently on Arimidex  here for routine follow-up  Patient has completed 5 years of endocrine therapy in May 2025.  She had 2 out of 2 sentinel lymph nodes positive for malignancy 1 with micrometastatic disease and 1 with micrometastatic disease and therefore consideration can also be given to extend hormone therapy to 10 years instead of 5 years.  Patient would like to move forward with BCI testing even if it means out-of-pocket payment to decide if she can stop at 5 years versus continuing for 5 more years.  I will tentatively see her back in 1 year no labs and we will call her with the results of BCI testing.  Clinically she is doing well with no concerning signs and symptoms of recurrence based on today's exam.  Her mammogram is scheduled for next week   Visit Diagnosis 1. Visit for monitoring Arimidex  therapy   2. Encounter for follow-up surveillance of breast cancer   3. High risk medication use  Dr. Seretha Dance, MD, MPH [facility] [phone] 12/05/2023 3:37 PM

## 2023-12-06 NOTE — Telephone Encounter (Signed)
 Received email from [place] with cost information for BCI.  Outbound call to patient; reviewed email briefly forwarded by [facility], breast navigator.  Informed patient I would send her a my chart message with the email details.  Patient said she will review with her husband; informed can also contact her insurance to inquire where she stands deductible, copay and coinsurance wise.  Advised patient to contact us  at the center or send us  a my chart message if and when she decides to move forward with BCI; patient verbalized understanding.

## 2023-12-12 ENCOUNTER — Ambulatory Visit
Admission: RE | Admit: 2023-12-12 | Discharge: 2023-12-12 | Disposition: A | Discharge status: Home / Self Care | Source: Ambulatory Visit | Attending: Oncology | Admitting: Oncology

## 2023-12-12 DIAGNOSIS — Z1231 Encounter for screening mammogram for malignant neoplasm of breast: Secondary | ICD-10-CM | POA: Diagnosis not present

## 2023-12-12 DIAGNOSIS — Z853 Personal history of malignant neoplasm of breast: Secondary | ICD-10-CM | POA: Insufficient documentation

## 2023-12-12 DIAGNOSIS — Z08 Encounter for follow-up examination after completed treatment for malignant neoplasm: Secondary | ICD-10-CM | POA: Diagnosis present

## 2023-12-14 ENCOUNTER — Telehealth: Payer: Self-pay | Admitting: *Deleted

## 2023-12-14 NOTE — Telephone Encounter (Signed)
 Dr. Randy Buttery had asked if the patient wanted to have the BCI testing to see if she needs to continue on the AI for another 5 years years, the patient called back and said she checked with her insurance and it will pay all of it so she would like that to be done and let her know.

## 2023-12-17 ENCOUNTER — Encounter: Payer: Self-pay | Admitting: Oncology

## 2023-12-17 ENCOUNTER — Encounter: Payer: Self-pay | Admitting: *Deleted

## 2023-12-17 NOTE — Telephone Encounter (Signed)
 BCI testing order placed online via [facility] online portal

## 2023-12-17 NOTE — Progress Notes (Addendum)
 Breast Cancer Index order placed online via [facility] online portal.  Patient notified that test has been ordered and we will notify her of the results when they come back.

## 2024-01-28 ENCOUNTER — Other Ambulatory Visit: Payer: Self-pay | Admitting: Medical Genetics

## 2024-01-29 ENCOUNTER — Telehealth: Payer: Self-pay | Admitting: *Deleted

## 2024-01-29 NOTE — Telephone Encounter (Signed)
 Discussed Breast Cancer Index test results with Ms. Shindler.  She would like to discuss further with Dr. Melanee, appointment has been scheduled for 8/27 at 10:30.

## 2024-01-30 ENCOUNTER — Encounter: Payer: Self-pay | Admitting: Oncology

## 2024-02-18 ENCOUNTER — Other Ambulatory Visit
Admission: RE | Admit: 2024-02-18 | Discharge: 2024-02-18 | Disposition: A | Discharge status: Home / Self Care | Payer: Self-pay | Source: Ambulatory Visit | Attending: Medical Genetics | Admitting: Medical Genetics

## 2024-02-19 ENCOUNTER — Ambulatory Visit: Payer: BC Managed Care – PPO | Admitting: Dermatology

## 2024-02-26 LAB — GENECONNECT MOLECULAR SCREEN: Genetic Analysis Overall Interpretation: NEGATIVE

## 2024-02-27 ENCOUNTER — Inpatient Hospital Stay: Attending: Oncology | Admitting: Oncology

## 2024-02-27 ENCOUNTER — Encounter: Payer: Self-pay | Admitting: Oncology

## 2024-02-27 VITALS — BP 138/97 | HR 87 | Temp 97.5°F | Resp 17 | Wt 123.0 lb

## 2024-02-27 DIAGNOSIS — Z923 Personal history of irradiation: Secondary | ICD-10-CM | POA: Insufficient documentation

## 2024-02-27 DIAGNOSIS — Z170 Estrogen receptor positive status [ER+]: Secondary | ICD-10-CM | POA: Diagnosis not present

## 2024-02-27 DIAGNOSIS — Z1721 Progesterone receptor positive status: Secondary | ICD-10-CM | POA: Insufficient documentation

## 2024-02-27 DIAGNOSIS — Z79899 Other long term (current) drug therapy: Secondary | ICD-10-CM | POA: Insufficient documentation

## 2024-02-27 DIAGNOSIS — Z853 Personal history of malignant neoplasm of breast: Secondary | ICD-10-CM | POA: Diagnosis not present

## 2024-02-27 DIAGNOSIS — Z79811 Long term (current) use of aromatase inhibitors: Secondary | ICD-10-CM | POA: Diagnosis not present

## 2024-02-27 DIAGNOSIS — Z08 Encounter for follow-up examination after completed treatment for malignant neoplasm: Secondary | ICD-10-CM | POA: Diagnosis not present

## 2024-02-27 DIAGNOSIS — C50412 Malignant neoplasm of upper-outer quadrant of left female breast: Secondary | ICD-10-CM | POA: Insufficient documentation

## 2024-02-27 DIAGNOSIS — Z1732 Human epidermal growth factor receptor 2 negative status: Secondary | ICD-10-CM | POA: Insufficient documentation

## 2024-02-27 NOTE — Progress Notes (Signed)
 Concerns of chest tightness, hot flashes and depression symptoms sees therapist

## 2024-02-27 NOTE — Progress Notes (Signed)
 Hematology/Oncology Consult note [facility]  [phone] [phone]  Patient Care Team: Verdon Keen, MD as PCP - General (Obstetrics and Gynecology) Melanee Annah BROCKS, MD as Consulting Physician (Hematology and Oncology)   Name of the patient: Tabitha Tucker  [id number]  [birth year]   Date of visit: 02/27/24  Diagnosis- history of stage I ER positive breast cancer on Arimidex    Chief complaint/ Reason for visit-routine follow-up of breast cancer on Arimidex   Heme/Onc history: patient is a 63 year old Caucasian female who underwent a screening mammogram which showed 1.7 x 1.2 x 1.6 cm hypoechoic irregular area in her left breast at 1 o'clock position.  No evidence of left axillary adenopathy.  This was 5 biopsied and was invasive mammary carcinoma grade 2, ER greater than 90% positive PR 11 to 50% positive and HER-2/neu negative.  Patient underwent lumpectomy with sentinel lymph node biopsy on 07/25/2018.  Final pathology showed invasive mammary carcinoma 1.7 cm, grade 2 with negative margins.  2 out of 2 sentinel lymph nodes were positive for malignancy.  One lymph node was involved by micrometastatic carcinoma 1.5 mm and the other lymph node was involved with macro metastatic disease 5 mm.  There was focal microscopy extracapsular extension present which I verified with Dr. Janel from pathology.  pT1c pN1A.  Systemic CT scans denied by insurance.  She completed adjuvant radiation treatment   Patient underwent MammaPrint testing which came back as low risk with no significant benefit from adjuvant chemotherapy.  Patient started taking Arimidex  in May 2020.  Patient was contemplating stopping endocrine therapy after 5 years.  BCI testing however showed extended benefit of taking endocrine therapy for 10 years.  Risk of breast cancer recurrence would go down from 11% to 3.5 to 4.5% with endocrine therapy    Interval history-patient has been on  Arimidex  for the last 5 years but over the last few weeks patient has been having increasing pain in her bilateral breasts.  She also reports having drenching night sweats almost daily as well as hot flashes and mood swings.  She reports increase in depression symptoms but does not report any suicidal or homicidal ideations.  Occasional pain in her abdomen which migrates from the right side to left side.  She had similar symptoms in the past and underwent CT abdomen and pelvis with contrast in December 2023 for the same reason which was unremarkable  ECOG PS- 0 Pain scale- 3   Review of systems- Review of Systems  Constitutional:  Negative for chills, fever, malaise/fatigue and weight loss.  HENT:  Negative for congestion, ear discharge and nosebleeds.   Eyes:  Negative for blurred vision.  Respiratory:  Negative for cough, hemoptysis, sputum production, shortness of breath and wheezing.   Cardiovascular:  Negative for chest pain, palpitations, orthopnea and claudication.  Gastrointestinal:  Negative for abdominal pain, blood in stool, constipation, diarrhea, heartburn, melena, nausea and vomiting.  Genitourinary:  Negative for dysuria, flank pain, frequency, hematuria and urgency.  Musculoskeletal:  Negative for back pain, joint pain and myalgias.  Skin:  Negative for rash.  Neurological:  Negative for dizziness, tingling, focal weakness, seizures, weakness and headaches.  Endo/Heme/Allergies:  Does not bruise/bleed easily.  Psychiatric/Behavioral:  Negative for depression and suicidal ideas. The patient does not have insomnia.       Allergies  Allergen Reactions   Shellfish Allergy      Past Medical History:  Diagnosis Date   Breast cancer (HCC) 07/2018   Dysplastic  nevus 01/11/2023   RUQA periumbilical - severe, but margins free, recheck in 6 mths   High cholesterol    Panic attacks    Personal history of radiation therapy    PONV (postoperative nausea and vomiting)       Past Surgical History:  Procedure Laterality Date   ABDOMINAL HYSTERECTOMY     pt did keep ovaries   APPENDECTOMY     ate age 40   BREAST BIOPSY Left 07/10/2018   INVASIVE MAMMARY CARCINOMA   BREAST LUM[facility]OMY [facility] 07/25/2018   [facility],    BREAST LUMPECTOMY WITH NEEDLE LOCALIZATION AND AXILLARY SENTINEL LYMPH NODE BX Left 07/25/2018   Procedure: BREAST LUMPECTOMY WITH NEEDLE LOCALIZATION AND SENTINEL NODE BX;  Surgeon: Tye Millet, DO;  Location: [facility] ORS;  Service: General;  Laterality: Left;    Social History   Socioeconomic History   Marital status: Married    Spouse name: Not on file   Number of children: Not on file   Years of education: Not on file   Highest education level: Not on file  Occupational History   Not on file  Tobacco Use   Smoking status: Never   Smokeless tobacco: Never  Vaping Use   Vaping status: Never Used  Substance and Sexual Activity   Alcohol use: Yes    Alcohol/week: 14.0 standard drinks of alcohol    Types: 14 Glasses of wine per week    Comment: 2 glasses of wine every day   Drug use: Not Currently    Types: Marijuana    Comment: marijuana when she was a teenager a few time   Sexual activity: Yes  Other Topics Concern   Not on file  Social History Narrative   Not on file   Social Drivers of Health   Financial Resource Strain: Low Risk  (08/02/2023)   Received from [facility]   Overall Financial Resource Strain (CARDIA)    Difficulty of Paying Living Expenses: Not hard at all  Food Insecurity: No Food Insecurity (08/02/2023)   Received from [facility]   Hunger Vital Sign    Within the past 12 months, you worried that your food would run out before you got the money to buy more.: Never true    Within the past 12 months, the food you bought just didn't last and you didn't have money to get more.: Never true  Transportation Needs: No Transportation Needs (08/02/2023)   Received from [facility] - Transportation    In the past 12 months, has lack of transportation kept you from medical appointments or from getting medications?: No    Lack of Transportation (Non-Medical): No  Physical Activity: Not on file  Stress: Not on file  Social Connections: Not on file  Intimate Partner Violence: Not At Risk (07/20/2023)   Humiliation, Afraid, Rape, and Kick questionnaire    Fear of Current or Ex-Partner: No    Emotionally Abused: No    Physically Abused: No    Sexually Abused: No    Family History  Problem Relation Age of Onset   Hypertension Father    Breast cancer Neg Hx      Current Outpatient Medications:    ALPRAZolam (XANAX) 0.25 MG tablet, Take 0.25 mg by mouth., Disp: , Rfl:    anastrozole  (ARIMIDEX ) 1 MG tablet, Take 1 tablet (1 mg total) by mouth daily., Disp: 90 tablet, Rfl: 2   B Complex Vitamins (VITAMIN B COMPLEX)  TABS, Take 1 tablet by mouth daily., Disp: , Rfl:    B Complex-C (SUPER B COMPLEX PO), Take 1 tablet by mouth daily., Disp: , Rfl:    Calcium  Carbonate-Vit D-Min (CALCIUM  600+D PLUS MINERALS) 600-400 MG-UNIT TABS, Take 2 tablets by mouth daily., Disp: , Rfl:    pantoprazole  (PROTONIX ) 40 MG tablet, Take by mouth., Disp: , Rfl:    simvastatin  (ZOCOR ) 10 MG tablet, Take 10 mg by mouth daily., Disp: , Rfl:    FLUoxetine  (PROZAC ) 20 MG capsule, Take 1 capsule (20 mg total) by mouth daily. (Patient not taking: Reported on 02/27/2024), Disp: 30 capsule, Rfl: 0   Garlic 1000 MG CAPS, Take 1,000 mg by mouth daily. (Patient not taking: Reported on 02/27/2024), Disp: , Rfl:    propranolol  (INDERAL ) 10 MG tablet, Take 1 tablet (10 mg total) by mouth daily. (Patient not taking: Reported on 02/27/2024), Disp: 30 tablet, Rfl: 0   traZODone  (DESYREL ) 50 MG tablet, Take 1 tablet (50 mg total) by mouth at bedtime as needed for sleep. (Patient not taking: Reported on 02/27/2024), Disp: 30 tablet, Rfl: 0  Physical exam:  Vitals:   02/27/24 1102  02/27/24 1105  BP: (!) 164/88 (!) 138/97  Pulse: 89 87  Resp: 17   Temp: (!) 97.5 F (36.4 C)   TempSrc: Tympanic   SpO2: 99%   Weight: 123 lb (55.8 kg)    Physical Exam Cardiovascular:     Rate and Rhythm: Normal rate and regular rhythm.     Heart sounds: Normal heart sounds.  Pulmonary:     Effort: Pulmonary effort is normal.     Breath sounds: Normal breath sounds.  Abdominal:     General: Bowel sounds are normal.     Palpations: Abdomen is soft.  Skin:    General: Skin is warm and dry.  Neurological:     Mental Status: She is alert and oriented to person, place, and time.    Breast exam was performed in seated and lying down position. Patient is status post left lumpectomy with a well-healed surgical scar. No evidence of any palpable masses. No evidence of axillary adenopathy. No evidence of any palpable masses or lumps in the right breast. No evidence of right axillary adenopathy   I have personally reviewed labs listed below:    Latest Ref Rng & Units 07/20/2023    3:27 AM  CMP  Glucose 70 - 99 mg/dL 881   BUN 8 - 23 mg/dL 15   Creatinine 9.55 - 1.00 mg/dL 8.89   Sodium 864 - 854 mmol/L 144   Potassium 3.5 - 5.1 mmol/L 4.3   Chloride 98 - 111 mmol/L 104   CO2 22 - 32 mmol/L 22   Calcium  8.9 - 10.3 mg/dL 9.3   Total Protein 6.5 - 8.1 g/dL 8.3   Total Bilirubin 0.0 - 1.2 mg/dL 0.8   Alkaline Phos 38 - 126 U/L 70   AST 15 - 41 U/L 34   ALT 0 - 44 U/L 20       Latest Ref Rng & Units 07/20/2023    3:27 AM  CBC  WBC 4.0 - 10.5 K/uL 16.6   Hemoglobin 12.0 - 15.0 g/dL 84.7   Hematocrit 63.9 - 46.0 % 45.5   Platelets 150 - 400 K/uL 347      Assessment and plan- Patient is a 63 y.o. female with history of stage I ER positive left breast cancer currently on Arimidex  here for routine follow-up  Clinically  patient is doing well with no concerning signs and symptoms of recurrence based on today's exam.  Mammogram from June 2025 was unremarkable.  Baseline bone  density scan and subsequent bone densities have been normal.  BCI testing showed benefit for extended endocrine therapy for 10 years instead of stopping at 5 years.  Patient has been on Arimidex  for the last 5 years but over the last few weeks has complained of night sweats hot flashes and mood swings as well as pain in her bilateral breasts.  I have asked her to hold off on taking Arimidex  for a month or so and see if any of the symptoms get better.  I will see her back in 2 months and at that time I will decide about perhaps putting her on an alternative aromatase inhibitor   Visit Diagnosis 1. Encounter for follow-up surveillance of breast cancer      Dr. Annah Skene, MD, MPH [facility] [phone] 02/27/2024 4:02 PM

## 2024-04-28 ENCOUNTER — Inpatient Hospital Stay: Admitting: Nurse Practitioner

## 2024-04-28 ENCOUNTER — Encounter: Payer: Self-pay | Admitting: Nurse Practitioner

## 2024-04-28 ENCOUNTER — Inpatient Hospital Stay: Attending: Oncology

## 2024-04-28 VITALS — BP 152/109 | HR 81 | Temp 97.3°F | Resp 16 | Ht 64.0 in | Wt 127.5 lb

## 2024-04-28 DIAGNOSIS — Z170 Estrogen receptor positive status [ER+]: Secondary | ICD-10-CM

## 2024-04-28 DIAGNOSIS — C50412 Malignant neoplasm of upper-outer quadrant of left female breast: Secondary | ICD-10-CM | POA: Insufficient documentation

## 2024-04-28 DIAGNOSIS — G8929 Other chronic pain: Secondary | ICD-10-CM

## 2024-04-28 DIAGNOSIS — R109 Unspecified abdominal pain: Secondary | ICD-10-CM | POA: Diagnosis not present

## 2024-04-28 DIAGNOSIS — Z79811 Long term (current) use of aromatase inhibitors: Secondary | ICD-10-CM | POA: Insufficient documentation

## 2024-04-28 DIAGNOSIS — Z853 Personal history of malignant neoplasm of breast: Secondary | ICD-10-CM

## 2024-04-28 LAB — CBC WITH DIFFERENTIAL (CANCER CENTER ONLY)
Abs Immature Granulocytes: 0.02 K/uL (ref 0.00–0.07)
Basophils Absolute: 0 K/uL (ref 0.0–0.1)
Basophils Relative: 0 %
Eosinophils Absolute: 0.1 K/uL (ref 0.0–0.5)
Eosinophils Relative: 1 %
HCT: 41.5 % (ref 36.0–46.0)
Hemoglobin: 13.8 g/dL (ref 12.0–15.0)
Immature Granulocytes: 0 %
Lymphocytes Relative: 25 %
Lymphs Abs: 1.6 K/uL (ref 0.7–4.0)
MCH: 31.7 pg (ref 26.0–34.0)
MCHC: 33.3 g/dL (ref 30.0–36.0)
MCV: 95.2 fL (ref 80.0–100.0)
Monocytes Absolute: 0.4 K/uL (ref 0.1–1.0)
Monocytes Relative: 6 %
Neutro Abs: 4.3 K/uL (ref 1.7–7.7)
Neutrophils Relative %: 68 %
Platelet Count: 307 K/uL (ref 150–400)
RBC: 4.36 MIL/uL (ref 3.87–5.11)
RDW: 11.9 % (ref 11.5–15.5)
WBC Count: 6.4 K/uL (ref 4.0–10.5)
nRBC: 0 % (ref 0.0–0.2)

## 2024-04-28 LAB — CMP (CANCER CENTER ONLY)
ALT: 19 U/L (ref 0–44)
AST: 27 U/L (ref 15–41)
Albumin: 4.4 g/dL (ref 3.5–5.0)
Alkaline Phosphatase: 58 U/L (ref 38–126)
Anion gap: 9 (ref 5–15)
BUN: 16 mg/dL (ref 8–23)
CO2: 24 mmol/L (ref 22–32)
Calcium: 9.5 mg/dL (ref 8.9–10.3)
Chloride: 103 mmol/L (ref 98–111)
Creatinine: 1 mg/dL (ref 0.44–1.00)
GFR, Estimated: >60 mL/min (ref >60)
Glucose, Bld: 99 mg/dL (ref 70–99)
Potassium: 4.3 mmol/L (ref 3.5–5.1)
Sodium: 136 mmol/L (ref 135–145)
Total Bilirubin: 0.8 mg/dL (ref 0.0–1.2)
Total Protein: 7.3 g/dL (ref 6.5–8.1)

## 2024-04-28 MED ORDER — EXEMESTANE 25 MG PO TABS
25.0000 mg | ORAL_TABLET | Freq: Every day | ORAL | 2 refills | Status: AC
Start: 2024-04-28 — End: 2024-07-27

## 2024-04-28 NOTE — Progress Notes (Unsigned)
 Pain under rt ribcage, come and goes, x2 years. Can feel like pressure at times.

## 2024-04-28 NOTE — Progress Notes (Unsigned)
 Hematology/Oncology Consult note [facility]  [phone] [phone]  Patient Care Team: Verdon Keen, MD as PCP - General (Obstetrics and Gynecology) Melanee Annah BROCKS, MD as Consulting Physician (Hematology and Oncology)   Name of the patient: Tabitha Tucker  [id number]  Aug 29, 1960   Date of visit: 04/28/24  Diagnosis- history of stage I ER positive breast cancer   Chief complaint/ Reason for visit-routine follow-up of breast cancer   Heme/Onc history: patient is a 63 year old Caucasian female who underwent a screening mammogram which showed 1.7 x 1.2 x 1.6 cm hypoechoic irregular area in her left breast at 1 o'clock position.  No evidence of left axillary adenopathy.  This was 5 biopsied and was invasive mammary carcinoma grade 2, ER greater than 90% positive PR 11 to 50% positive and HER-2/neu negative.  Patient underwent lumpectomy with sentinel lymph node biopsy on 07/25/2018.  Final pathology showed invasive mammary carcinoma 1.7 cm, grade 2 with negative margins.  2 out of 2 sentinel lymph nodes were positive for malignancy.  One lymph node was involved by micrometastatic carcinoma 1.5 mm and the other lymph node was involved with macro metastatic disease 5 mm.  There was focal microscopy extracapsular extension present which I verified with Dr. Janel from pathology.  pT1c pN1A.  Systemic CT scans denied by insurance.  She completed adjuvant radiation treatment   Patient underwent MammaPrint testing which came back as low risk with no significant benefit from adjuvant chemotherapy.  Patient started taking Arimidex  in May 2020.  Patient was contemplating stopping endocrine therapy after 5 years.  BCI testing however showed extended benefit of taking endocrine therapy for 10 years.  Risk of breast cancer recurrence would go down from 11% to 3.5 to 4.5% with endocrine therapy    Interval history-patient had been on Arimidex  for the last 5 years  but presented to the clinic last visit with complaints of having increasing pain in her bilateral breasts.  She was also reporting night sweats, hot flashes, mood swings with worsening depression.  At that time she denied any suicidal or homicidal ideations.  She continues to complain of occasional pain in her abdomen which migrates from the right side to left side.  She had similar symptoms in the past and underwent CT abdomen and pelvis with contrast in December 2023 for the same reason which was unremarkable.  She denies any nausea, vomiting, or diarrhea.  ECOG PS- 0 Pain scale- 3   Review of systems- Review of Systems  Constitutional:  Negative for chills, fever, malaise/fatigue and weight loss.  HENT:  Negative for congestion, ear discharge and nosebleeds.   Eyes:  Negative for blurred vision.  Respiratory:  Negative for cough, hemoptysis, sputum production, shortness of breath and wheezing.   Cardiovascular:  Negative for chest pain, palpitations, orthopnea and claudication.  Gastrointestinal:  Negative for abdominal pain, blood in stool, constipation, diarrhea, heartburn, melena, nausea and vomiting.  Genitourinary:  Negative for dysuria, flank pain, frequency, hematuria and urgency.  Musculoskeletal:  Negative for back pain, joint pain and myalgias.  Skin:  Negative for rash.  Neurological:  Negative for dizziness, tingling, focal weakness, seizures, weakness and headaches.  Endo/Heme/Allergies:  Does not bruise/bleed easily.  Psychiatric/Behavioral:  Negative for depression and suicidal ideas. The patient does not have insomnia.       Allergies  Allergen Reactions   Shellfish Allergy      Past Medical History:  Diagnosis Date   Breast cancer (HCC) 07/2018  Dysplastic nevus 01/11/2023   RUQA periumbilical - severe, but margins free, recheck in 6 mths   High cholesterol    Panic attacks    Personal history of radiation therapy    PONV (postoperative nausea and vomiting)       Past Surgical History:  Procedure Laterality Date   ABDOMINAL HYSTERECTOMY     pt did keep ovaries   APPENDECTOMY     ate age 71   BREAST BIOPSY Left 07/10/2018   INVASIVE MAMMARY CARCINOMA   BREAST LUMPECTOMY Left 07/25/2018   [facility],    BREAST LUMPECTOMY WITH NEEDLE LOCALIZATION AND AXILLARY SENTINEL LYMPH NODE BX Left 07/25/2018   Procedure: BREAST LUMPECTOMY WITH NEEDLE LOCALIZATION AND SENTINEL NODE BX;  Surgeon: Tye Millet, DO;  Location: [facility] ORS;  Service: General;  Laterality: Left;    Social History   Socioeconomic History   Marital status: Married    Spouse name: Not on file   Number of children: Not on file   Years of education: Not on file   Highest education level: Not on file  Occupational History   Not on file  Tobacco Use   Smoking status: Never   Smokeless tobacco: Never  Vaping Use   Vaping status: Never Used  Substance and Sexual Activity   Alcohol use: Yes    Alcohol/week: 14.0 standard drinks of alcohol    Types: 14 Glasses of wine per week    Comment: 2 glasses of wine every day   Drug use: Not Currently    Types: Marijuana    Comment: marijuana when she was a teenager a few time   Sexual activity: Yes  Other Topics Concern   Not on file  Social History Narrative   Not on file   Social Drivers of Health   Financial Resource Strain: Low Risk  (08/02/2023)   Received from [facility]   Overall Financial Resource Strain (CARDIA)    Difficulty of Paying Living Expenses: Not hard at all  Food Insecurity: No Food Insecurity (08/02/2023)   Received from [facility]   Hunger Vital Sign    Within the past 12 months, you worried that your food would run out before you got the money to buy more.: Never true    Within the past 12 months, the food you bought just didn't last and you didn't have money to get more.: Never true  Transportation Needs: No Transportation Needs (08/02/2023)   Received from [facility] - Transportation    In the past 12 months, has lack of transportation kept you from medical appointments or from getting medications?: No    Lack of Transportation (Non-Medical): No  Physical Activity: Not on file  Stress: Not on file  Social Connections: Not on file  Intimate Partner Violence: Not At Risk (07/20/2023)   Humiliation, Afraid, Rape, and Kick questionnaire    Fear of Current or Ex-Partner: No    Emotionally Abused: No    Physically Abused: No    Sexually Abused: No    Family History  Problem Relation Age of Onset   Hypertension Father    Breast cancer Neg Hx      Current Outpatient Medications:    ALPRAZolam (XANAX) 0.25 MG tablet, Take 0.25 mg by mouth., Disp: , Rfl:    B Complex-C (SUPER B COMPLEX PO), Take 1 tablet by mouth daily., Disp: , Rfl:    Calcium  Carbonate-Vit D-Min (CALCIUM  600+D PLUS MINERALS) 600-400  MG-UNIT TABS, Take 2 tablets by mouth daily., Disp: , Rfl:    exemestane (AROMASIN) 25 MG tablet, Take 1 tablet (25 mg total) by mouth daily after breakfast., Disp: 30 tablet, Rfl: 2   pantoprazole  (PROTONIX ) 40 MG tablet, Take by mouth., Disp: , Rfl:    simvastatin  (ZOCOR ) 10 MG tablet, Take 10 mg by mouth daily., Disp: , Rfl:   Physical exam:  Vitals:   04/28/24 1402 04/28/24 1418  BP: (!) 139/91 (!) 152/109  Pulse: 81   Resp: 16   Temp: (!) 97.3 F (36.3 C)   TempSrc: Tympanic   SpO2: 99%   Weight: 127 lb 8 oz (57.8 kg)   Height: 5' 4 (1.626 m)    Physical Exam Cardiovascular:     Rate and Rhythm: Normal rate and regular rhythm.     Heart sounds: Normal heart sounds.  Pulmonary:     Effort: Pulmonary effort is normal.     Breath sounds: Normal breath sounds.  Abdominal:     General: Bowel sounds are normal.     Palpations: Abdomen is soft.  Skin:    General: Skin is warm and dry.  Neurological:     Mental Status: She is alert and oriented to person, place, and time.      I have personally  reviewed labs listed below:    Latest Ref Rng & Units 04/28/2024    2:03 PM  CMP  Glucose 70 - 99 mg/dL 99   BUN 8 - 23 mg/dL 16   Creatinine 9.55 - 1.00 mg/dL 8.99   Sodium 864 - 854 mmol/L 136   Potassium 3.5 - 5.1 mmol/L 4.3   Chloride 98 - 111 mmol/L 103   CO2 22 - 32 mmol/L 24   Calcium  8.9 - 10.3 mg/dL 9.5   Total Protein 6.5 - 8.1 g/dL 7.3   Total Bilirubin 0.0 - 1.2 mg/dL 0.8   Alkaline Phos 38 - 126 U/L 58   AST 15 - 41 U/L 27   ALT 0 - 44 U/L 19       Latest Ref Rng & Units 04/28/2024    2:03 PM  CBC  WBC 4.0 - 10.5 K/uL 6.4   Hemoglobin 12.0 - 15.0 g/dL 86.1   Hematocrit 63.9 - 46.0 % 41.5   Platelets 150 - 400 K/uL 307      Assessment and plan- Patient is a 63 y.o. female with history of stage I ER positive left breast cancer currently on a break from Arimidex  due to side effects, here to discuss plan to try another aromatase inhibitor.    Clinically patient is doing well with no concerning signs and symptoms of recurrence based on today's exam.  Mammogram from June 2025 was unremarkable.  Baseline bone density scan and subsequent bone densities have been normal.  BCI testing showed benefit for extended endocrine therapy for 10 years instead of stopping at 5 years.  Patient has been on Arimidex  for the last 5 years but has been on a 2 month break due to complains of night sweats, hot flashes and mood swings as well as pain in her bilateral breasts.  Today she reports complete resolution of hot flashes, night sweats with improvement in mood swings, and depression.  She reports that breast pain comes and goes but is less frequent.  We discussed risk vs. benefit of trying aromasin today with the plan to follow up in 3 months.  I instructed her to call clinic if she does  not tolerate aromasin well and we can get her into the clinic sooner than 3 months.    Patient continues to have intermittent right upper quadrant abdominal pain for the past 2 years.  Patient has followed  up with GI in 2023 in which she was worked up with ultrasound, CT scan, and HIDA scan which all testing were unremarkable and didn't find the cause for this pain.  I did look back in chart and viewed these scans as well as labs which are unremarkable.  She describes the pain as intermittent, not occurring daily, and feeling like a dull ache that radiates from right to left side.  She denies any nausea, vomiting, diarrhea.  Denies any abdominal pain today.  No changes in appetite and reports that pain is not specific to her diet.  This intermittent pain does not seem to be associated with aromatase inhibitors as stopping the medication did not change frequency or nature of the pain.  I discussed ongoing intermittent pain with Dr. Melanee, and will order CT scan of pelvis and abdomen as her last scan was 2023.     Visit Diagnosis 1. Malignant neoplasm of upper-outer quadrant of left breast in female, estrogen receptor positive (HCC)   2. Abdominal pain   Disposition: Start aromasin today F/u with NP/MD with cbc and cmp in 3 months LP CT of abdomen and pelvis ordered    Morna Husband NP [facility] [phone] 04/28/2024 2:52 PM

## 2024-04-29 ENCOUNTER — Encounter: Payer: Self-pay | Admitting: Nurse Practitioner

## 2024-05-08 ENCOUNTER — Ambulatory Visit
Admission: RE | Admit: 2024-05-08 | Discharge: 2024-05-08 | Disposition: A | Discharge status: Home / Self Care | Source: Ambulatory Visit | Attending: Nurse Practitioner | Admitting: Nurse Practitioner

## 2024-05-08 DIAGNOSIS — G8929 Other chronic pain: Secondary | ICD-10-CM | POA: Insufficient documentation

## 2024-05-08 DIAGNOSIS — R109 Unspecified abdominal pain: Secondary | ICD-10-CM | POA: Diagnosis present

## 2024-05-08 MED ORDER — IOHEXOL 300 MG/ML  SOLN
100.0000 mL | Freq: Once | INTRAMUSCULAR | Status: AC | PRN
  Administered 2024-05-08: 100 mL via INTRAVENOUS

## 2024-05-13 ENCOUNTER — Ambulatory Visit: Admitting: Dermatology

## 2024-05-13 ENCOUNTER — Encounter: Payer: Self-pay | Admitting: Dermatology

## 2024-05-13 DIAGNOSIS — Z853 Personal history of malignant neoplasm of breast: Secondary | ICD-10-CM

## 2024-05-13 DIAGNOSIS — L300 Nummular dermatitis: Secondary | ICD-10-CM

## 2024-05-13 DIAGNOSIS — Z1283 Encounter for screening for malignant neoplasm of skin: Secondary | ICD-10-CM

## 2024-05-13 DIAGNOSIS — Z7189 Other specified counseling: Secondary | ICD-10-CM

## 2024-05-13 DIAGNOSIS — L2089 Other atopic dermatitis: Secondary | ICD-10-CM

## 2024-05-13 DIAGNOSIS — L578 Other skin changes due to chronic exposure to nonionizing radiation: Secondary | ICD-10-CM | POA: Diagnosis not present

## 2024-05-13 DIAGNOSIS — L821 Other seborrheic keratosis: Secondary | ICD-10-CM

## 2024-05-13 DIAGNOSIS — L820 Inflamed seborrheic keratosis: Secondary | ICD-10-CM

## 2024-05-13 DIAGNOSIS — Z79899 Other long term (current) drug therapy: Secondary | ICD-10-CM

## 2024-05-13 DIAGNOSIS — W908XXA Exposure to other nonionizing radiation, initial encounter: Secondary | ICD-10-CM

## 2024-05-13 DIAGNOSIS — I781 Nevus, non-neoplastic: Secondary | ICD-10-CM

## 2024-05-13 DIAGNOSIS — L814 Other melanin hyperpigmentation: Secondary | ICD-10-CM

## 2024-05-13 DIAGNOSIS — Z86018 Personal history of other benign neoplasm: Secondary | ICD-10-CM

## 2024-05-13 DIAGNOSIS — D1801 Hemangioma of skin and subcutaneous tissue: Secondary | ICD-10-CM

## 2024-05-13 MED ORDER — MOMETASONE FUROATE 0.1 % EX CREA: 1.0000 | TOPICAL_CREAM | Freq: Every day | CUTANEOUS | 0 refills | Status: AC | PRN

## 2024-05-13 NOTE — Progress Notes (Unsigned)
 Follow-Up Visit   Subjective  Tabitha Tucker is a 63 y.o. female who presents for the following: Skin Cancer Screening and Full Body Skin Exam hx of Dyspalastic Nevus, skin tags intermammary, irritating, itching  The patient presents for Total-Body Skin Exam (TBSE) for skin cancer screening and mole check. The patient has spots, moles and lesions to be evaluated, some may be new or changing and the patient may have concern these could be cancer.  The following portions of the chart were reviewed this encounter and updated as appropriate: medications, allergies, medical history  Review of Systems:  No other skin or systemic complaints except as noted in HPI or Assessment and Plan.  Objective  Well appearing patient in no apparent distress; mood and affect are within normal limits.  A full examination was performed including scalp, head, eyes, ears, nose, lips, neck, chest, axillae, abdomen, back, buttocks, bilateral upper extremities, bilateral lower extremities, hands, feet, fingers, toes, fingernails, and toenails. All findings within normal limits unless otherwise noted below.   Relevant physical exam findings are noted in the Assessment and Plan.  back x 3, intermammary x 2 (5) Stuck on waxy paps with erythema  Assessment & Plan   SKIN CANCER SCREENING PERFORMED TODAY.  ACTINIC DAMAGE - Chronic condition, secondary to cumulative UV/sun exposure - diffuse scaly erythematous macules with underlying dyspigmentation - Recommend daily broad spectrum sunscreen SPF 30+ to sun-exposed areas, reapply every 2 hours as needed.  - Staying in the shade or wearing long sleeves, sun glasses (UVA+UVB protection) and wide brim hats (4-inch brim around the entire circumference of the hat) are also recommended for sun protection.  - Call for new or changing lesions.  LENTIGINES, SEBORRHEIC KERATOSES, HEMANGIOMAS - Benign normal skin lesions - Benign-appearing - Call for any changes  MELANOCYTIC  NEVI - Tan-brown and/or pink-flesh-colored symmetric macules and papules - Benign appearing on exam today - Observation - Call clinic for new or changing moles - Recommend daily use of broad spectrum spf 30+ sunscreen to sun-exposed areas.   HISTORY OF DYSPLASTIC NEVUS No evidence of recurrence today Recommend regular full body skin exams Recommend daily broad spectrum sunscreen SPF 30+ to sun-exposed areas, reapply every 2 hours as needed.  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 Condition is bothersome/symptomatic for patient. Currently flared. Nummular dermatitis (eczema) is a chronic, relapsing, itchy rash that can significantly affect quality of life. It is often associated with dry skin and flares in the wintertime, and may require treatment with prescription topical anti-inflammatory medications, in addition to gentle skin care.  If there is associated atopic dermatitis and topicals are not working, then biologic injections may be necessary to clear rash and control symptoms. Treatment Plan: Start mometasone cr qd/bid until clear then prn flares  Recommend mild soap and moisturizing cream 1-2 times daily.  Gentle skin care handout provided.     Topical steroids (such as triamcinolone, fluocinolone , fluocinonide, mometasone, clobetasol, halobetasol, betamethasone, hydrocortisone) can cause thinning and lightening of the skin if they are used for too long in the same area. Your physician has selected the right strength medicine for your problem and area affected on the body. Please use your medication only as directed by your physician to prevent side effects.   TELANGIECTASIA Exam: dilated blood vessel(s) Treatment Plan:  Benign appearing on exam Call for changes Counseling for BBL  / IPL / Laser and Coordination of Care Discussed the treatment option of Broad Band Light (BBL) /Intense Pulsed Light (IPL)/ Laser for skin discoloration, including brown spots and redness.  Typically we recommend at least 1-3 treatment sessions about 5-8 weeks apart for best results.  Cannot have tanned skin when BBL performed, and regular use of sunscreen/photoprotection is advised after the procedure to help maintain results. The patient's condition may also require maintenance treatments in the future.  The fee for BBL / laser treatments is $350 per treatment session for the whole face.  A fee can be quoted for other parts of the body.  Insurance typically does not pay for BBL/laser treatments and therefore the fee is an out-of-pocket cost. Recommend prophylactic valtrex treatment. Once scheduled for procedure, will send Rx in prior to patient's appointment.   HISTORY of BREAST CANCER L breast Exam: clear today with visual exam Treatment Plan: Cont f/u with Oncology   INFLAMED SEBORRHEIC KERATOSIS (5) back x 3, intermammary x 2 (5) Symptomatic, irritating, patient would like treated. Destruction of lesion - back x 3, intermammary x 2 (5) Complexity: simple   Destruction method: cryotherapy   Informed consent: discussed and consent obtained   Timeout:  patient name, date of birth, surgical site, and procedure verified Lesion destroyed using liquid nitrogen: Yes   Region frozen until ice ball extended beyond lesion: Yes   Outcome: patient tolerated procedure well with no complications   Post-procedure details: wound care instructions given    Return in about 1 year (around 05/13/2025) for TBSE, Hx of Dysplastic nevi.  I, Grayce Saunas, RMA, am acting as scribe for Alm Rhyme, MD .   Documentation: I have reviewed the above documentation for accuracy and completeness, and I agree with the above.  Alm Rhyme, MD

## 2024-05-13 NOTE — Patient Instructions (Addendum)

## 2024-05-14 ENCOUNTER — Encounter: Payer: Self-pay | Admitting: Dermatology

## 2024-05-27 ENCOUNTER — Encounter: Payer: Self-pay | Admitting: Oncology

## 2024-07-01 ENCOUNTER — Other Ambulatory Visit: Payer: Self-pay | Admitting: Oncology

## 2024-07-01 DIAGNOSIS — C50412 Malignant neoplasm of upper-outer quadrant of left female breast: Secondary | ICD-10-CM

## 2024-07-02 ENCOUNTER — Other Ambulatory Visit: Payer: Self-pay | Admitting: Nurse Practitioner

## 2024-07-29 ENCOUNTER — Other Ambulatory Visit: Payer: Self-pay

## 2024-07-29 ENCOUNTER — Inpatient Hospital Stay: Attending: Oncology | Admitting: Oncology

## 2024-07-29 ENCOUNTER — Inpatient Hospital Stay

## 2024-07-29 VITALS — BP 132/86 | HR 78 | Temp 96.7°F | Resp 18 | Ht 64.0 in | Wt 134.6 lb

## 2024-07-29 DIAGNOSIS — R110 Nausea: Secondary | ICD-10-CM | POA: Diagnosis not present

## 2024-07-29 DIAGNOSIS — R109 Unspecified abdominal pain: Secondary | ICD-10-CM | POA: Insufficient documentation

## 2024-07-29 DIAGNOSIS — Z5181 Encounter for therapeutic drug level monitoring: Secondary | ICD-10-CM | POA: Diagnosis not present

## 2024-07-29 DIAGNOSIS — Z79811 Long term (current) use of aromatase inhibitors: Secondary | ICD-10-CM | POA: Insufficient documentation

## 2024-07-29 DIAGNOSIS — G8929 Other chronic pain: Secondary | ICD-10-CM | POA: Diagnosis not present

## 2024-07-29 DIAGNOSIS — C50412 Malignant neoplasm of upper-outer quadrant of left female breast: Secondary | ICD-10-CM | POA: Insufficient documentation

## 2024-07-29 DIAGNOSIS — Z79899 Other long term (current) drug therapy: Secondary | ICD-10-CM | POA: Diagnosis not present

## 2024-07-29 DIAGNOSIS — Z853 Personal history of malignant neoplasm of breast: Secondary | ICD-10-CM

## 2024-07-29 DIAGNOSIS — Z170 Estrogen receptor positive status [ER+]: Secondary | ICD-10-CM | POA: Diagnosis not present

## 2024-07-29 DIAGNOSIS — Z08 Encounter for follow-up examination after completed treatment for malignant neoplasm: Secondary | ICD-10-CM

## 2024-07-29 LAB — CMP (CANCER CENTER ONLY)
ALT: 13 U/L (ref 0–44)
AST: 21 U/L (ref 15–41)
Albumin: 4.4 g/dL (ref 3.5–5.0)
Alkaline Phosphatase: 61 U/L (ref 38–126)
Anion gap: 9 (ref 5–15)
BUN: 16 mg/dL (ref 8–23)
CO2: 27 mmol/L (ref 22–32)
Calcium: 9.3 mg/dL (ref 8.9–10.3)
Chloride: 105 mmol/L (ref 98–111)
Creatinine: 0.87 mg/dL (ref 0.44–1.00)
GFR, Estimated: >60 mL/min
Glucose, Bld: 102 mg/dL — ABNORMAL HIGH (ref 70–99)
Potassium: 5 mmol/L (ref 3.5–5.1)
Sodium: 141 mmol/L (ref 135–145)
Total Bilirubin: 0.3 mg/dL (ref 0.0–1.2)
Total Protein: 7 g/dL (ref 6.5–8.1)

## 2024-07-29 LAB — CBC WITH DIFFERENTIAL (CANCER CENTER ONLY)
Abs Immature Granulocytes: 0.02 10*3/uL (ref 0.00–0.07)
Basophils Absolute: 0 10*3/uL (ref 0.0–0.1)
Basophils Relative: 1 %
Eosinophils Absolute: 0.1 10*3/uL (ref 0.0–0.5)
Eosinophils Relative: 1 %
HCT: 41.5 % (ref 36.0–46.0)
Hemoglobin: 13.6 g/dL (ref 12.0–15.0)
Immature Granulocytes: 0 %
Lymphocytes Relative: 25 %
Lymphs Abs: 1.6 10*3/uL (ref 0.7–4.0)
MCH: 31.3 pg (ref 26.0–34.0)
MCHC: 32.8 g/dL (ref 30.0–36.0)
MCV: 95.4 fL (ref 80.0–100.0)
Monocytes Absolute: 0.4 10*3/uL (ref 0.1–1.0)
Monocytes Relative: 6 %
Neutro Abs: 4.4 10*3/uL (ref 1.7–7.7)
Neutrophils Relative %: 67 %
Platelet Count: 265 10*3/uL (ref 150–400)
RBC: 4.35 MIL/uL (ref 3.87–5.11)
RDW: 11.7 % (ref 11.5–15.5)
WBC Count: 6.5 10*3/uL (ref 4.0–10.5)
nRBC: 0 % (ref 0.0–0.2)

## 2024-07-29 MED ORDER — ONDANSETRON HCL 4 MG PO TABS: 4.0000 mg | ORAL_TABLET | Freq: Three times a day (TID) | ORAL | 1 refills | Status: AC | PRN

## 2024-07-29 MED ORDER — ONDANSETRON HCL 4 MG PO TABS: 4.0000 mg | ORAL_TABLET | Freq: Three times a day (TID) | ORAL | 1 refills | Status: DC | PRN

## 2024-07-29 NOTE — Progress Notes (Signed)
 New medication: nexium 20mg  daily.  Trouble staying asleep.  Underlying nausea every now and then in the mornings.  Occasional chest and right breast pain, gets the zingers and sore. Feels like a knot where she had her cancer and she feels it pop and go away; also gets pain in that breast too and under arm.  Left hand occasionally gets tingly & numb. Still has pain up under right rib cage, like pressure but sometimes pain but not today

## 2024-07-29 NOTE — Progress Notes (Signed)
 "    Hematology/Oncology Consult note [facility]  [phone] [phone]  Patient Care Team: Verdon Keen, MD as PCP - General (Obstetrics and Gynecology) Melanee Annah BROCKS, MD as Consulting Physician (Hematology and Oncology)   Name of the patient: Tabitha Tucker  [id number]  21-Aug-1960   Date of visit: 07/29/24  Diagnosis-history of stage I ER positive breast cancer on Arimidex   Chief complaint/ Reason for visit-routine surveillance visit for breast cancer  Heme/Onc history: patient is a 64 year old Caucasian female who underwent a screening mammogram which showed 1.7 x 1.2 x 1.6 cm hypoechoic irregular area in her left breast at 1 o'clock position.  No evidence of left axillary adenopathy.  This was 5 biopsied and was invasive mammary carcinoma grade 2, ER greater than 90% positive PR 11 to 50% positive and HER-2/neu negative.  Patient underwent lumpectomy with sentinel lymph node biopsy on 07/25/2018.  Final pathology showed invasive mammary carcinoma 1.7 cm, grade 2 with negative margins.  2 out of 2 sentinel lymph nodes were positive for malignancy.  One lymph node was involved by micrometastatic carcinoma 1.5 mm and the other lymph node was involved with macro metastatic disease 5 mm.  There was focal microscopy extracapsular extension present which I verified with Dr. Janel from pathology.  pT1c pN1A.  Systemic CT scans denied by insurance.  She completed adjuvant radiation treatment   Patient underwent MammaPrint testing which came back as low risk with no significant benefit from adjuvant chemotherapy.  Patient started taking Arimidex  in May 2020.  Patient was contemplating stopping endocrine therapy after 5 years.  BCI testing however showed extended benefit of taking endocrine therapy for 10 years.  Risk of breast cancer recurrence would go down from 11% to 3.5 to 4.5% with endocrine therapy.  Patient is therefore decided to take endocrine  therapy for 10 years.  She was briefly switched from Arimidex  to exemestane  due to concerns of night sweats hot flashes and mood swings.  She could not tolerate exemestane  and switched back to Arimidex   Interval history- Tabitha Tucker is a 64 year old female with hormone receptor-positive breast cancer who presents for follow-up of adjuvant endocrine therapy tolerance and management of chronic abdominal pain.  She is currently taking letrozole after previously stopping exemestane  due to side effects. BCI testing was performed in August, and the patient recalls being told it may be beneficial to continue endocrine therapy for ten years instead of five. She was switched to exemestane  in October but developed persistent diarrhea and severe nausea, which significantly impaired her quality of life and disrupted travel. She discontinued exemestane  after several weeks, remained off endocrine therapy for approximately ten days, and then resumed letrozole. Since resuming letrozole, she experiences only mild morning nausea, which is manageable and occasionally treated with over-the-counter dimenhydrinate. She denies current severe or persistent nausea and has not experienced further diarrhea since discontinuing exemestane . She has not had recurrence of significant hot flashes or other adverse effects since resuming letrozole.  She continues to experience intermittent abdominal pain described as pressure, occurring in phases--sometimes persisting all day and at other times absent for several days. She has undergone extensive gastrointestinal evaluation, including three CT scans approximately two years ago, all of which were unremarkable. She manages symptoms with acetaminophen  and denies use of other antispasmodic medications. The abdominal pain does not appear to be associated with any new or worsening symptoms.      History of Present Illness   ECOG PS- 0 Pain scale-  0   Review of systems- Review of Systems   Constitutional:  Negative for chills, fever, malaise/fatigue and weight loss.  HENT:  Negative for congestion, ear discharge and nosebleeds.   Eyes:  Negative for blurred vision.  Respiratory:  Negative for cough, hemoptysis, sputum production, shortness of breath and wheezing.   Cardiovascular:  Negative for chest pain, palpitations, orthopnea and claudication.  Gastrointestinal:  Negative for abdominal pain, blood in stool, constipation, diarrhea, heartburn, melena, nausea and vomiting.  Genitourinary:  Negative for dysuria, flank pain, frequency, hematuria and urgency.  Musculoskeletal:  Negative for back pain, joint pain and myalgias.  Skin:  Negative for rash.  Neurological:  Negative for dizziness, tingling, focal weakness, seizures, weakness and headaches.  Endo/Heme/Allergies:  Does not bruise/bleed easily.  Psychiatric/Behavioral:  Negative for depression and suicidal ideas. The patient does not have insomnia.       Allergies[1]   Past Medical History:  Diagnosis Date   Breast cancer (HCC) 07/2018   Dysplastic nevus 01/11/2023   RUQA periumbilical - severe, but margins free, clear 05/13/24   High cholesterol    Panic attacks    Personal history of radiation therapy    PONV (postoperative nausea and vomiting)      Past Surgical History:  Procedure Laterality Date   ABDOMINAL HYSTERECTOMY     pt did keep ovaries   APPENDECTOMY     ate age 24   BREAST BIOPSY Left 07/10/2018   INVASIVE MAMMARY CARCINOMA   BREAST LUMPECTOMY Left 07/25/2018   [facility],    BREAST LUMPECTOMY WITH NEEDLE LOCALIZATION AND AXILLARY SENTINEL LYMPH NODE BX Left 07/25/2018   Procedure: BREAST LUMPECTOMY WITH NEEDLE LOCALIZATION AND SENTINEL NODE BX;  Surgeon: Tye Millet, DO;  Location: [facility] ORS;  Service: General;  Laterality: Left;    Social History   Socioeconomic History   Marital status: Married    Spouse name: Not on file   Number of children: Not on file   Years of education: Not on  file   Highest education level: Not on file  Occupational History   Not on file  Tobacco Use   Smoking status: Never   Smokeless tobacco: Never  Vaping Use   Vaping status: Never Used  Substance and Sexual Activity   Alcohol use: Yes    Alcohol/week: 14.0 standard drinks of alcohol    Types: 14 Glasses of wine per week    Comment: 2 glasses of wine every day   Drug use: Not Currently    Types: Marijuana    Comment: marijuana when she was a teenager a few time   Sexual activity: Yes  Other Topics Concern   Not on file  Social History Narrative   Not on file   Social Drivers of Health   Tobacco Use: Low Risk  (05/15/2024)   Received from [facility]   Patient History    Smoking Tobacco Use: Never    Smokeless Tobacco Use: Never    Passive Exposure: Not on file  Financial Resource Strain: Low Risk  (08/02/2023)   Received from [facility]   Overall Financial Resource Strain (CARDIA)    Difficulty of Paying Living Expenses: Not hard at all  Food Insecurity: No Food Insecurity (08/02/2023)   Received from [facility]   Epic    Within the past 12 months, you worried that your food would run out before you got the money to buy more.: Never true    Within the  past 12 months, the food you bought just didn't last and you didn't have money to get more.: Never true  Transportation Needs: No Transportation Needs (08/02/2023)   Received from [facility] - Transportation    In the past 12 months, has lack of transportation kept you from medical appointments or from getting medications?: No    Lack of Transportation (Non-Medical): No  Physical Activity: Not on file  Stress: Not on file  Social Connections: Not on file  Intimate Partner Violence: Not At Risk (07/20/2023)   Humiliation, Afraid, Rape, and Kick questionnaire    Fear of Current or Ex-Partner: No    Emotionally Abused: No    Physically Abused:  No    Sexually Abused: No  Depression (PHQ2-9): Low Risk (07/29/2024)   Depression (PHQ2-9)    PHQ-2 Score: 0  Alcohol Screen: Low Risk (07/20/2023)   Alcohol Screen    Last Alcohol Screening Score (AUDIT): 4  Housing: Low Risk  (08/02/2023)   Received from [facility]   Epic    In the last 12 months, was there a time when you were not able to pay the mortgage or rent on time?: No    In the past 12 months, how many times have you moved where you were living?: 0    At any time in the past 12 months, were you homeless or living in a shelter (including now)?: No  Utilities: At Risk (08/02/2023)   Received from [facility] Utilities    Threatened with loss of utilities: Yes  Health Literacy: Not on file    Family History  Problem Relation Age of Onset   Hypertension Father    Breast cancer Neg Hx     Current Medications[2]  Physical exam:  Vitals:   07/29/24 1137 07/29/24 1147  BP: (!) 141/87 132/86  Pulse: 78   Resp: 18   Temp: (!) 96.7 F (35.9 C)   TempSrc: Tympanic   SpO2: 99%   Weight: 134 lb 9.6 oz (61.1 kg)   Height: 5' 4 (1.626 m)    Physical Exam Cardiovascular:     Rate and Rhythm: Normal rate and regular rhythm.     Heart sounds: Normal heart sounds.  Pulmonary:     Effort: Pulmonary effort is normal.     Breath sounds: Normal breath sounds.  Skin:    General: Skin is warm and dry.  Neurological:     Mental Status: She is alert and oriented to person, place, and time.    Breast exam was performed in seated and lying down position. Patient is status post left lumpectomy with a well-healed surgical scar. No evidence of any palpable masses. No evidence of axillary adenopathy. No evidence of any palpable masses or lumps in the right breast. No evidence of right axillary adenopathy   I have personally reviewed labs listed below:    Latest Ref Rng & Units 07/29/2024   11:17 AM  CMP  Glucose 70 - 99 mg/dL 897   BUN  8 - 23 mg/dL 16   Creatinine 9.55 - 1.00 mg/dL 9.12   Sodium 864 - 854 mmol/L 141   Potassium 3.5 - 5.1 mmol/L 5.0   Chloride 98 - 111 mmol/L 105   CO2 22 - 32 mmol/L 27   Calcium  8.9 - 10.3 mg/dL 9.3   Total Protein 6.5 - 8.1 g/dL 7.0   Total Bilirubin 0.0 - 1.2 mg/dL 0.3  Alkaline Phos 38 - 126 U/L 61   AST 15 - 41 U/L 21   ALT 0 - 44 U/L 13       Latest Ref Rng & Units 07/29/2024   11:17 AM  CBC  WBC 4.0 - 10.5 K/uL 6.5   Hemoglobin 12.0 - 15.0 g/dL 86.3   Hematocrit 63.9 - 46.0 % 41.5   Platelets 150 - 400 K/uL 265      Assessment and plan- Patient is a 64 y.o. female with history of stage I left breast cancer ER/PR positive HER2 negative presently on Arimidex  here for routine follow-up  Assessment and Plan    Hormone receptor-positive breast cancer -Clinically patient is doing well with no concerning signs and symptoms of recurrence based on today's exam Remission maintained on adjuvant endocrine therapy. BCI testing supports benefit from extending therapy to ten years. Tolerated Arimidex  well.  She is also taking calcium  and vitamin D . - Continue Arimidex  for endocrine therapy to end in 2030 - Bone density scan scheduled for July 2026.  Bone density scan 2 years ago was normal - Mammogram scheduled for June 2026. - Follow-up in six months.  Adverse effects of endocrine therapy Severe nausea and diarrhea with Aromasin  led to discontinuation. Mild, intermittent nausea on Arimidex , manageable with symptomatic treatment. - Prescribed ondansetron  30 tablets for nausea. - Advised to request refills as needed.  Chronic abdominal pain Intermittent abdominal pressure and pain with unrevealing GI workup. - Trial of over-the-counter dicyclomine for possible colonic spasms. - Reassured regarding prior negative CT scans and absence of concerning findings.         Visit Diagnosis 1. High risk medication use   2. Encounter for follow-up surveillance of breast cancer   3.  Visit for monitoring Arimidex  therapy      Dr. Annah Skene, MD, MPH [facility] [phone] 07/29/2024 12:19 PM                   [1] No Active Allergies [2]  Current Outpatient Medications:    ALPRAZolam (XANAX) 0.25 MG tablet, Take 0.25 mg by mouth., Disp: , Rfl:    anastrozole  (ARIMIDEX ) 1 MG tablet, TAKE 1 TABLET BY MOUTH ONCE DAILY, Disp: 90 tablet, Rfl: 2   B Complex-C (SUPER B COMPLEX PO), Take 1 tablet by mouth daily., Disp: , Rfl:    Calcium  Carbonate-Vit D-Min (CALCIUM  600+D PLUS MINERALS) 600-400 MG-UNIT TABS, Take 2 tablets by mouth daily., Disp: , Rfl:    esomeprazole (NEXIUM) 20 MG packet, Take 20 mg by mouth daily before breakfast., Disp: , Rfl:    simvastatin  (ZOCOR ) 10 MG tablet, Take 10 mg by mouth daily., Disp: , Rfl:    mometasone  (ELOCON ) 0.1 % cream, Apply 1 Application topically daily as needed (Rash). qd to bid aa eczema on right calf until clear, then prn flares, Disp: 45 g, Rfl: 0   ondansetron  (ZOFRAN ) 4 MG tablet, Take 1 tablet (4 mg total) by mouth every 8 (eight) hours as needed for nausea or vomiting., Disp: 90 tablet, Rfl: 1   pantoprazole  (PROTONIX ) 40 MG tablet, Take by mouth. (Patient not taking: Reported on 07/29/2024), Disp: , Rfl:   "

## 2024-12-05 ENCOUNTER — Ambulatory Visit: Admitting: Oncology

## 2024-12-12 ENCOUNTER — Encounter

## 2025-01-22 ENCOUNTER — Other Ambulatory Visit

## 2025-01-26 ENCOUNTER — Inpatient Hospital Stay: Admitting: Oncology

## 2025-05-13 ENCOUNTER — Ambulatory Visit: Admitting: Dermatology
